# Patient Record
Sex: Female | Born: 1955 | Race: White | Hispanic: No | Smoking: Former smoker
Health system: Southern US, Community
[De-identification: ages and names within clinical notes are randomized; demographics above are authoritative.]

## PROBLEM LIST (undated history)

## (undated) DIAGNOSIS — J40 Bronchitis, not specified as acute or chronic: Secondary | ICD-10-CM

## (undated) DIAGNOSIS — E7800 Pure hypercholesterolemia, unspecified: Secondary | ICD-10-CM

## (undated) DIAGNOSIS — J449 Chronic obstructive pulmonary disease, unspecified: Secondary | ICD-10-CM

## (undated) HISTORY — PX: CERVICAL CONE BIOPSY: SUR198

## (undated) HISTORY — PX: CARPAL TUNNEL RELEASE: SHX101

## (undated) HISTORY — PX: TONSILLECTOMY: SUR1361

## (undated) HISTORY — PX: OTHER SURGICAL HISTORY: SHX169

---

## 2005-07-10 ENCOUNTER — Emergency Department (HOSPITAL_COMMUNITY): Admission: EM | Admit: 2005-07-10 | Discharge: 2005-07-10 | Payer: Self-pay | Admitting: Emergency Medicine

## 2005-11-28 ENCOUNTER — Encounter
Admission: RE | Admit: 2005-11-28 | Discharge: 2006-02-26 | Payer: Self-pay | Admitting: Physical Medicine & Rehabilitation

## 2005-11-28 ENCOUNTER — Ambulatory Visit: Payer: Self-pay | Admitting: Physical Medicine & Rehabilitation

## 2006-01-12 ENCOUNTER — Ambulatory Visit: Payer: Self-pay | Admitting: Physical Medicine & Rehabilitation

## 2006-01-23 ENCOUNTER — Ambulatory Visit: Payer: Self-pay | Admitting: Physical Medicine & Rehabilitation

## 2006-02-20 ENCOUNTER — Encounter
Admission: RE | Admit: 2006-02-20 | Discharge: 2006-05-21 | Payer: Self-pay | Admitting: Physical Medicine & Rehabilitation

## 2006-04-28 ENCOUNTER — Ambulatory Visit: Payer: Self-pay | Admitting: Physical Medicine & Rehabilitation

## 2006-04-28 ENCOUNTER — Encounter
Admission: RE | Admit: 2006-04-28 | Discharge: 2006-07-27 | Payer: Self-pay | Admitting: Physical Medicine & Rehabilitation

## 2006-06-02 ENCOUNTER — Ambulatory Visit: Payer: Self-pay | Admitting: Physical Medicine & Rehabilitation

## 2006-06-14 ENCOUNTER — Ambulatory Visit: Payer: Self-pay | Admitting: Physical Medicine & Rehabilitation

## 2006-06-22 ENCOUNTER — Ambulatory Visit: Payer: Self-pay | Admitting: Physical Medicine & Rehabilitation

## 2006-07-19 ENCOUNTER — Ambulatory Visit: Payer: Self-pay | Admitting: Physical Medicine & Rehabilitation

## 2011-08-10 ENCOUNTER — Other Ambulatory Visit: Payer: Self-pay | Admitting: Family Medicine

## 2011-08-10 ENCOUNTER — Other Ambulatory Visit (HOSPITAL_COMMUNITY)
Admission: RE | Admit: 2011-08-10 | Discharge: 2011-08-10 | Disposition: A | Discharge status: Home / Self Care | Payer: Managed Care, Other (non HMO) | Source: Ambulatory Visit | Attending: Family Medicine | Admitting: Family Medicine

## 2011-08-10 DIAGNOSIS — Z01419 Encounter for gynecological examination (general) (routine) without abnormal findings: Secondary | ICD-10-CM | POA: Insufficient documentation

## 2012-08-10 ENCOUNTER — Other Ambulatory Visit: Payer: Self-pay | Admitting: Family Medicine

## 2012-08-10 DIAGNOSIS — Z1231 Encounter for screening mammogram for malignant neoplasm of breast: Secondary | ICD-10-CM

## 2014-08-18 ENCOUNTER — Encounter (HOSPITAL_COMMUNITY): Payer: Self-pay | Admitting: Emergency Medicine

## 2014-08-18 ENCOUNTER — Emergency Department (HOSPITAL_COMMUNITY): Payer: Managed Care, Other (non HMO)

## 2014-08-18 ENCOUNTER — Inpatient Hospital Stay (HOSPITAL_COMMUNITY)
Admission: EM | Admit: 2014-08-18 | Discharge: 2014-08-21 | DRG: 190 | Disposition: A | Discharge status: Home / Self Care | Payer: Self-pay | Attending: Family Medicine | Admitting: Family Medicine

## 2014-08-18 DIAGNOSIS — E780 Pure hypercholesterolemia: Secondary | ICD-10-CM | POA: Diagnosis present

## 2014-08-18 DIAGNOSIS — J209 Acute bronchitis, unspecified: Secondary | ICD-10-CM | POA: Diagnosis present

## 2014-08-18 DIAGNOSIS — J440 Chronic obstructive pulmonary disease with acute lower respiratory infection: Secondary | ICD-10-CM | POA: Diagnosis present

## 2014-08-18 DIAGNOSIS — Z888 Allergy status to other drugs, medicaments and biological substances status: Secondary | ICD-10-CM

## 2014-08-18 DIAGNOSIS — Z720 Tobacco use: Secondary | ICD-10-CM

## 2014-08-18 DIAGNOSIS — J449 Chronic obstructive pulmonary disease, unspecified: Secondary | ICD-10-CM | POA: Diagnosis present

## 2014-08-18 DIAGNOSIS — F17210 Nicotine dependence, cigarettes, uncomplicated: Secondary | ICD-10-CM | POA: Diagnosis present

## 2014-08-18 DIAGNOSIS — R0789 Other chest pain: Secondary | ICD-10-CM

## 2014-08-18 DIAGNOSIS — J441 Chronic obstructive pulmonary disease with (acute) exacerbation: Principal | ICD-10-CM | POA: Diagnosis present

## 2014-08-18 DIAGNOSIS — J9601 Acute respiratory failure with hypoxia: Secondary | ICD-10-CM | POA: Diagnosis present

## 2014-08-18 HISTORY — DX: Bronchitis, not specified as acute or chronic: J40

## 2014-08-18 HISTORY — DX: Pure hypercholesterolemia, unspecified: E78.00

## 2014-08-18 LAB — BASIC METABOLIC PANEL
Anion gap: 13 (ref 5–15)
BUN: 9 mg/dL (ref 6–23)
CO2: 27 meq/L (ref 19–32)
Calcium: 9.5 mg/dL (ref 8.4–10.5)
Chloride: 102 mEq/L (ref 96–112)
Creatinine, Ser: 0.63 mg/dL (ref 0.50–1.10)
GFR calc Af Amer: >90 mL/min (ref >90)
GFR calc non Af Amer: >90 mL/min (ref >90)
Glucose, Bld: 100 mg/dL — ABNORMAL HIGH (ref 70–99)
POTASSIUM: 4.5 meq/L (ref 3.7–5.3)
Sodium: 142 mEq/L (ref 137–147)

## 2014-08-18 LAB — CBC
HEMATOCRIT: 40.7 % (ref 36.0–46.0)
Hemoglobin: 13.5 g/dL (ref 12.0–15.0)
MCH: 30.7 pg (ref 26.0–34.0)
MCHC: 33.2 g/dL (ref 30.0–36.0)
MCV: 92.5 fL (ref 78.0–100.0)
Platelets: 178 10*3/uL (ref 150–400)
RBC: 4.4 MIL/uL (ref 3.87–5.11)
RDW: 12.6 % (ref 11.5–15.5)
WBC: 6.6 10*3/uL (ref 4.0–10.5)

## 2014-08-18 LAB — TROPONIN I: Troponin I: <0.3 ng/mL (ref <0.30)

## 2014-08-18 LAB — I-STAT TROPONIN, ED: Troponin i, poc: 0 ng/mL (ref 0.00–0.08)

## 2014-08-18 MED ORDER — METHYLPREDNISOLONE SODIUM SUCC 125 MG IJ SOLR
80.0000 mg | Freq: Four times a day (QID) | INTRAMUSCULAR | Status: DC
  Administered 2014-08-18 – 2014-08-21 (×11): 80 mg via INTRAVENOUS
  Filled 2014-08-18 (×14): qty 1.28

## 2014-08-18 MED ORDER — SODIUM CHLORIDE 0.9 % IJ SOLN
3.0000 mL | Freq: Two times a day (BID) | INTRAMUSCULAR | Status: DC
  Administered 2014-08-19 – 2014-08-21 (×5): 3 mL via INTRAVENOUS

## 2014-08-18 MED ORDER — IPRATROPIUM-ALBUTEROL 0.5-2.5 (3) MG/3ML IN SOLN
3.0000 mL | Freq: Once | RESPIRATORY_TRACT | Status: AC
  Administered 2014-08-18: 3 mL via RESPIRATORY_TRACT
  Filled 2014-08-18: qty 3

## 2014-08-18 MED ORDER — LEVOFLOXACIN IN D5W 750 MG/150ML IV SOLN
750.0000 mg | INTRAVENOUS | Status: DC
  Administered 2014-08-18 – 2014-08-21 (×4): 750 mg via INTRAVENOUS
  Filled 2014-08-18 (×4): qty 150

## 2014-08-18 MED ORDER — ONDANSETRON HCL 4 MG PO TABS: 4.0000 mg | ORAL_TABLET | Freq: Four times a day (QID) | ORAL | Status: DC | PRN

## 2014-08-18 MED ORDER — IPRATROPIUM-ALBUTEROL 0.5-2.5 (3) MG/3ML IN SOLN
3.0000 mL | Freq: Four times a day (QID) | RESPIRATORY_TRACT | Status: DC
  Administered 2014-08-18 – 2014-08-21 (×9): 3 mL via RESPIRATORY_TRACT
  Filled 2014-08-18 (×9): qty 3

## 2014-08-18 MED ORDER — ONDANSETRON HCL 4 MG/2ML IJ SOLN: 4.0000 mg | Freq: Four times a day (QID) | INTRAMUSCULAR | Status: DC | PRN

## 2014-08-18 MED ORDER — ACETAMINOPHEN 650 MG RE SUPP: 650.0000 mg | Freq: Four times a day (QID) | RECTAL | Status: DC | PRN

## 2014-08-18 MED ORDER — METHYLPREDNISOLONE SODIUM SUCC 125 MG IJ SOLR
80.0000 mg | Freq: Once | INTRAMUSCULAR | Status: AC
  Administered 2014-08-18: 80 mg via INTRAVENOUS
  Filled 2014-08-18: qty 1.28

## 2014-08-18 MED ORDER — ALBUTEROL SULFATE (2.5 MG/3ML) 0.083% IN NEBU
5.0000 mg | INHALATION_SOLUTION | Freq: Once | RESPIRATORY_TRACT | Status: AC
  Administered 2014-08-18: 5 mg via RESPIRATORY_TRACT
  Filled 2014-08-18: qty 6

## 2014-08-18 MED ORDER — METHYLPREDNISOLONE SODIUM SUCC 125 MG IJ SOLR: 125.0000 mg | Freq: Once | INTRAMUSCULAR | Status: DC

## 2014-08-18 MED ORDER — NICOTINE 21 MG/24HR TD PT24
21.0000 mg | MEDICATED_PATCH | Freq: Every day | TRANSDERMAL | Status: DC
  Administered 2014-08-18 – 2014-08-21 (×4): 21 mg via TRANSDERMAL
  Filled 2014-08-18 (×4): qty 1

## 2014-08-18 MED ORDER — ACETAMINOPHEN 325 MG PO TABS
650.0000 mg | ORAL_TABLET | Freq: Four times a day (QID) | ORAL | Status: DC | PRN
  Administered 2014-08-19 – 2014-08-21 (×2): 650 mg via ORAL
  Filled 2014-08-18 (×2): qty 2

## 2014-08-18 MED ORDER — ENOXAPARIN SODIUM 40 MG/0.4ML ~~LOC~~ SOLN
40.0000 mg | SUBCUTANEOUS | Status: DC
  Administered 2014-08-19 – 2014-08-21 (×3): 40 mg via SUBCUTANEOUS
  Filled 2014-08-18 (×3): qty 0.4

## 2014-08-18 MED ORDER — IPRATROPIUM-ALBUTEROL 0.5-2.5 (3) MG/3ML IN SOLN
3.0000 mL | RESPIRATORY_TRACT | Status: DC | PRN
  Administered 2014-08-20 (×2): 3 mL via RESPIRATORY_TRACT
  Filled 2014-08-18 (×2): qty 3

## 2014-08-18 NOTE — ED Notes (Signed)
Initial neb tx completed Exp wheezing still noted bilaterally O2 sat 94-99% on room air Patient in NAD  Side rails up, call bell in reach

## 2014-08-18 NOTE — ED Notes (Signed)
Exp wheezing still present after 2nd neb tx (Duoneb), see MAR Awaiting CXR Patient in NAD Side rails up, call bell in reach

## 2014-08-18 NOTE — ED Notes (Addendum)
Patient states that she "unable to breathe" Patient states that this has been ongoing since LAST Monday Patient able to speak in full, complete sentences without difficulty--handles secretions Patient has hx of bronchitis and continues to be an everyday smoker O2 sat 96-99% on room air Neb tx given, see MAR

## 2014-08-18 NOTE — Progress Notes (Signed)
UR completed 

## 2014-08-18 NOTE — Progress Notes (Signed)
  CARE MANAGEMENT ED NOTE 08/18/2014  Patient:  Nancy Farmer,Nancy Farmer   Account Number:  [id number]  Date Initiated:  08/18/2014  Documentation initiated by:  Edd ArbourGIBBS,Karia Ehresman  Subjective/Objective Assessment:   58 yr old Nancy Farmer ([place]) who confirmed with CM she has lost her job and insurance recently c/o sob, cough PMH bronchitis     Subjective/Objective Assessment Detail:   agreed to [facility] referral     Action/Plan:   CM spoke with pt to offer [place] uninsured resources see notes below Assisted with instructing pt to call son with phone in her room   Action/Plan Detail:   Anticipated DC Date:  08/21/2014     Status Recommendation to Physician:   Result of Recommendation:    Other ED Services  Consult Working Plan    DC Planning Services  Other  Outpatient Services - Pt will follow up  PCP issues  [facility] / [facility] (established/new)    Choice offered to / List presented to:            Status of service:  Completed, signed off  ED Comments:   ED Comments Detail:  CM spoke with pt who [place]. CM discussed and provided written information for self pay pcps, importance of pcp for f/u care, www.needymeds.org, discounted pharmacies and other [place] resources such as [facility], [facility], affordable care act,  financial assistance, DSS and  health department Reviewed resources for [place] self pay pcps like [facility], family medicine at [address], [facility], general medical clinics, [facility] plus others, medication resources, [facility] out patient pharmacies and housing Pt voiced understanding and appreciation of resources provided  Provided [facility] contact information Referral completed to [facility]

## 2014-08-18 NOTE — ED Notes (Signed)
Patient taken to CXR via stretcher Patient in NAD upon leaving room for testing

## 2014-08-18 NOTE — ED Notes (Signed)
Duoneb tx completed Exp wheezing improved O2 sat on room air 88-89% 2L [place] placed back on patient with O2 sat 91-93% Will make EDP aware

## 2014-08-18 NOTE — ED Notes (Signed)
Pt c/o shob, chest pain when she coughs and back pain since last Monday. while she was at work last week workers were digging the floors and the dust caused he to start having trouble breathing and coughing.  Pt denies asthma but has had PNA and bronchitis in the past.

## 2014-08-18 NOTE — ED Notes (Signed)
Patient back from CXR Exp wheezing still present  Additional Duoneb tx given, see MAR VS updated

## 2014-08-18 NOTE — H&P (Signed)
History and Physical  Nancy Farmer ZOX:[id number] DOB: [birth year] DOA: 08/18/2014   PCP: No PCP Per Patient   Chief Complaint: cough and sob  HPI:  58 year old female without any known chronic medical problems presents with one-week history of shortness of breath and coughing that has worsened over the past 48 hours prior to this admission. The patient denied any fevers, chills but complained of increasing cough with sputum production. No hemoptysis. She has not had any nausea, vomiting, diarrhea. Patient smokes one pack per day x40 years. Patient has had some associated chest discomfort which was worsened somewhat by activity. As a result, the patient in the emergency department for further evaluation. The patient has never been hospitalized for COPD exacerbation. She denies any abdominal pain, dysuria, hematuria, or drugs. She denies any alcohol use. The patient tried to use a leftover inhaler that was provided to her 2 years ago for bronchitis which did not help. In the emergency department, the patient was to just ensure with bronchitic changes. Point-of-care troponin was negative. EKG was negative for any ST-T wave changes. BMP and CBC were unremarkable Assessment/Plan: Acute respiratory failure with hypoxemia -Secondary to COPD exacerbation - Presently not in any respiratory distress -Oxygen saturation 93% on 2 L -Pulmonary hygiene -Plan to wean oxygen if possible fracture saturation greater than 92% COPD exacerbation -Patient states she has had a remote history to "steroids"--when further questioned, patient states that she developed a headache and itching, but does not recall the actual medicine which she took -Plan to give intravenous Solu-Medrol slowly   continue aerosolized albuterol and Atrovent  tobacco abuse  -Tobacco cessation discussed  -Nicoderm patch  Chest pain -ECG without ischemic changes -cycle troponins       Past Medical History  Diagnosis Date  .  Bronchitis   . High cholesterol    History reviewed. No pertinent past surgical history. Social History:  reports that she has been smoking Cigarettes.  She has been smoking about 0.00 packs per day. She does not have any smokeless tobacco history on file. She reports that she does not drink alcohol. Her drug history is not on file. Pt states she has >40 pack yr hx   Family History: reviewed.  No pertinent family history  Allergies  Allergen Reactions  . Other Itching and Rash    steriods      Prior to Admission medications   Medication Sig Start Date End Date Taking? Authorizing Provider  albuterol (PROAIR HFA) 108 (90 BASE) MCG/ACT inhaler Inhale 2 puffs into the lungs every 4 (four) hours as needed for wheezing or shortness of breath.   Yes Historical Provider, MD  guaiFENesin (MUCINEX) 600 MG 12 hr tablet Take 1,200 mg by mouth 2 (two) times daily.   Yes Historical Provider, MD    Review of Systems:  Constitutional:  No weight loss, night sweats, Fevers, chills Head&Eyes: No headache.  No vision loss.  No eye pain or scotoma ENT:  No Difficulty swallowing,Tooth/dental problems,Sore throat,  No ear ache, post nasal drip,  Cardio-vascular:  No chest pain, Orthopnea, PND, swelling in lower extremities,  dizziness, palpitations  GI:  No  abdominal pain, nausea, vomiting, diarrhea, loss of appetite, hematochezia, melena, heartburn, indigestion, Resp:  No coughing up of blood .No wheezing.No chest wall deformity  Skin:  no rash or lesions.  GU:  no dysuria, change in color of urine, no urgency or frequency. No flank pain.  Musculoskeletal:  No joint pain or swelling. No  decreased range of motion. No back pain.  Psych:  No change in mood or affect. Neurologic: No headache, no dysesthesia, no focal weakness, no vision loss. No syncope  Physical Exam: Filed Vitals:   08/18/14 1406 08/18/14 1407 08/18/14 1441 08/18/14 1447  BP: 113/52     Pulse: 90  87 91  Temp:        TempSrc:      Resp:      SpO2: 92% 90% 85% 95%   General:  A&O x 3, NAD, nontoxic, pleasant/cooperative Head/Eye: No conjunctival hemorrhage, no icterus, [place]/AT, No nystagmus ENT:  No icterus,  No thrush, good dentition, no pharyngeal exudate Neck:  No masses, no lymphadenpathy, no bruits CV:  RRR, no rub, no gallop, no S3 Lung:  Bilateral expiratory wheeze. Bibasilar crackles. Abdomen: soft/NT, +BS, nondistended, no peritoneal signs Ext: No cyanosis, No rashes, No petechiae, No lymphangitis, No edema   Labs on Admission:  Basic Metabolic Panel:  Recent Labs Lab 08/18/14 1158  NA 142  K 4.5  CL 102  CO2 27  GLUCOSE 100*  BUN 9  CREATININE 0.63  CALCIUM 9.5   Liver Function Tests: No results found for this basename: AST, ALT, ALKPHOS, BILITOT, PROT, ALBUMIN,  in the last 168 hours No results found for this basename: LIPASE, AMYLASE,  in the last 168 hours No results found for this basename: AMMONIA,  in the last 168 hours CBC:  Recent Labs Lab 08/18/14 1158  WBC 6.6  HGB 13.5  HCT 40.7  MCV 92.5  PLT 178   Cardiac Enzymes: No results found for this basename: CKTOTAL, CKMB, CKMBINDEX, TROPONINI,  in the last 168 hours BNP: No components found with this basename: POCBNP,  CBG: No results found for this basename: GLUCAP,  in the last 168 hours  Radiological Exams on Admission: Dg Chest 2 View (if Patient Has Fever And/or Copd)  08/18/2014   CLINICAL DATA:  Cough and shortness of breath after chemical exposure. Smoker.  EXAM: CHEST  2 VIEW  COMPARISON:  None.  FINDINGS: Normal sized heart. Clear lungs. Mild diffuse peribronchial thickening. Mild thoracic spine degenerative changes and minimal scoliosis.  IMPRESSION: Mild chronic bronchitic changes.  No acute abnormality.   Electronically Signed   By: Gordan PaymentSteve  Reid M.D.   On: 08/18/2014 14:00    EKG: Independently reviewed. Sinus rhythm, no ST-T wave changes    Time spent:60 minutes Code Status:   FULL Family  Communication:   No Family at bedside   Socorro Kanitz, DO  [facility] Pager [phone]  If 7PM-7AM, please contact night-coverage [email or web address] Password [email or web address] 08/18/2014, 3:37 PM

## 2014-08-18 NOTE — ED Provider Notes (Signed)
CSN: [id number]     Arrival date & time 08/18/14  1120 History   First MD Initiated Contact with Patient 08/18/14 1317     Chief Complaint  Patient presents with  . Shortness of Breath     (Consider location/radiation/quality/duration/timing/severity/associated sxs/prior Treatment) HPI Comments: Patient presents to the ER for evaluation of cough and shortness of breath. Patient reports that she has had increased cough over the last 3 days. She has not been running a fever. She has had increased shortness of breath and wheezing, has tried to use her albuterol without improvement. She does not report Lahey diagnosis of asthma or COPD. The inhaler was left over from a previous bronchitis episode.  Patient is a 58 y.o. female presenting with shortness of breath.  Shortness of Breath Associated symptoms: cough     Past Medical History  Diagnosis Date  . Bronchitis   . High cholesterol    History reviewed. No pertinent past surgical history. No family history on file. History  Substance Use Topics  . Smoking status: Current Every Day Smoker    Types: Cigarettes  . Smokeless tobacco: Not on file  . Alcohol Use: No   OB History   Grav Para Term Preterm Abortions TAB SAB Ect Mult Living                 Review of Systems  Respiratory: Positive for cough and shortness of breath.   All other systems reviewed and are negative.     Allergies  Other  Home Medications   Prior to Admission medications   Medication Sig Start Date End Date Taking? Authorizing Provider  albuterol (PROAIR HFA) 108 (90 BASE) MCG/ACT inhaler Inhale 2 puffs into the lungs every 4 (four) hours as needed for wheezing or shortness of breath.   Yes Historical Provider, MD  guaiFENesin (MUCINEX) 600 MG 12 hr tablet Take 1,200 mg by mouth 2 (two) times daily.   Yes Historical Provider, MD   BP 156/71  Pulse 88  Temp(Src) 98.1 F (36.7 C) (Oral)  Resp 20  SpO2 95% Physical Exam  Constitutional: She is  oriented to person, place, and time. She appears well-developed and well-nourished. No distress.  HENT:  Head: Normocephalic and atraumatic.  Right Ear: Hearing normal.  Left Ear: Hearing normal.  Nose: Nose normal.  Mouth/Throat: Oropharynx is clear and moist and mucous membranes are normal.  Eyes: Conjunctivae and EOM are normal. Pupils are equal, round, and reactive to light.  Neck: Normal range of motion. Neck supple.  Cardiovascular: Regular rhythm, S1 normal and S2 normal.  Exam reveals no gallop and no friction rub.   No murmur heard. Pulmonary/Chest: Effort normal. No respiratory distress. She has wheezes. She exhibits no tenderness.  Abdominal: Soft. Normal appearance and bowel sounds are normal. There is no hepatosplenomegaly. There is no tenderness. There is no rebound, no guarding, no tenderness at McBurney's point and negative Murphy's sign. No hernia.  Musculoskeletal: Normal range of motion.  Neurological: She is alert and oriented to person, place, and time. She has normal strength. No cranial nerve deficit or sensory deficit. Coordination normal. GCS eye subscore is 4. GCS verbal subscore is 5. GCS motor subscore is 6.  Skin: Skin is warm, dry and intact. No rash noted. No cyanosis.  Psychiatric: She has a normal mood and affect. Her speech is normal and behavior is normal. Thought content normal.    ED Course  Procedures (including critical care time) Labs Review Labs Reviewed  BASIC  METABOLIC PANEL - Abnormal; Notable for the following:    Glucose, Bld 100 (*)    All other components within normal limits  CBC  I-STAT TROPOININ, ED    Imaging Review No results found.   EKG Interpretation   Date/Time:  Monday August 18 2014 11:40:47 EDT Ventricular Rate:  87 PR Interval:  160 QRS Duration: 83 QT Interval:  360 QTC Calculation: 433 R Axis:   85 Text Interpretation:  Sinus rhythm Normal ECG Confirmed by POLLINA  MD,  CHRISTOPHER ([id number]) on 08/18/2014 1:20:03  PM      MDM   Final diagnoses:  None   bronchitis with acute bronchospasm  Patient presents to the ER for evaluation of cough and shortness of breath. Patient is a long-time smoker, but does not have a current diagnosis of asthma or COPD. She does report a previous history of bronchospasm with bronchitis requiring albuterol. She did try albuterol at home without improvement. The patient had significant bronchospasm here in the ER. She has had serial nebulizer treatments with only minimal improvement. Patient now is experiencing oxygen saturation of 82% on room air after treatments. She was placed back on some of the options will be admitted to the hospital.   Gilda Crease, MD 08/18/14 1450

## 2014-08-18 NOTE — ED Notes (Signed)
Attempted x 2 to start PIV without success--patient pulling away arm and kicking feet during PIV start Will make EDP aware Will ask another ED RN to attempt PIV access

## 2014-08-18 NOTE — ED Notes (Signed)
Per EDP, patient to be admitted

## 2014-08-19 LAB — BASIC METABOLIC PANEL
ANION GAP: 15 (ref 5–15)
BUN: 13 mg/dL (ref 6–23)
CO2: 24 mEq/L (ref 19–32)
Calcium: 9.7 mg/dL (ref 8.4–10.5)
Chloride: 100 mEq/L (ref 96–112)
Creatinine, Ser: 0.6 mg/dL (ref 0.50–1.10)
GFR calc Af Amer: >90 mL/min (ref >90)
Glucose, Bld: 163 mg/dL — ABNORMAL HIGH (ref 70–99)
POTASSIUM: 4.2 meq/L (ref 3.7–5.3)
SODIUM: 139 meq/L (ref 137–147)

## 2014-08-19 LAB — TROPONIN I: Troponin I: <0.3 ng/mL (ref <0.30)

## 2014-08-19 MED ORDER — UNABLE TO FIND: Status: DC

## 2014-08-19 MED ORDER — FAMOTIDINE 20 MG PO TABS
20.0000 mg | ORAL_TABLET | Freq: Two times a day (BID) | ORAL | Status: DC | PRN
  Filled 2014-08-19: qty 1

## 2014-08-19 MED ORDER — ZOLPIDEM TARTRATE 5 MG PO TABS
5.0000 mg | ORAL_TABLET | Freq: Every evening | ORAL | Status: DC | PRN
  Administered 2014-08-19: 5 mg via ORAL
  Filled 2014-08-19: qty 1

## 2014-08-19 NOTE — Progress Notes (Signed)
SATURATION QUALIFICATIONS: (This note is used to comply with regulatory documentation for home oxygen)  Patient Saturations on Room Air at Rest = 96%  Patient Saturations on Room Air while Ambulating = 93%  

## 2014-08-19 NOTE — Progress Notes (Signed)
PROGRESS NOTE  Nancy Farmer AOZ:[id number] DOB: August 30, 1956 DOA: 08/18/2014 PCP: No PCP Per Patient  Assessment/Plan: Acute respiratory failure with hypoxemia  -Secondary to COPD exacerbation  - Presently not in any respiratory distress  -Oxygen saturation 95% on 3 L  -Pulmonary hygiene  -Plan to wean oxygen if possible fracture saturation greater than 92%  -ambulatory pulseox prior to d/c COPD exacerbation  -Patient states she has had a remote history to "steroids"--when further questioned, patient states that she developed a headache and itching, but does not recall the actual medicine which she took  -Plan to give intravenous Solu-Medrol slowly-->pt tolerating well  continue aerosolized albuterol and Atrovent  -continue present dosing of solumedrol--moving better air but still significant wheeze tobacco abuse  -Tobacco cessation discussed  -Nicoderm patch  Chest pain  -ECG without ischemic changes  -cycle troponins--neg   Family Communication:   Pt at beside Disposition Plan:   Home when medically stable   Antibiotics:  Levofloxacin 08/18/14>>>    Procedures/Studies: Dg Chest 2 View (if Patient Has Fever And/or Copd)  08/18/2014   CLINICAL DATA:  Cough and shortness of breath after chemical exposure. Smoker.  EXAM: CHEST  2 VIEW  COMPARISON:  None.  FINDINGS: Normal sized heart. Clear lungs. Mild diffuse peribronchial thickening. Mild thoracic spine degenerative changes and minimal scoliosis.  IMPRESSION: Mild chronic bronchitic changes.  No acute abnormality.   Electronically Signed   By: Gordan Payment M.D.   On: 08/18/2014 14:00         Subjective: Pt breathing better but still has significant DOE.  Patient denies fevers, chills, headache,nausea, vomiting, diarrhea, abdominal pain, dysuria, hematuria   Objective: Filed Vitals:   08/18/14 2113 08/18/14 2130 08/19/14 0500 08/19/14 0846  BP: 143/67  113/64   Pulse: 83  91   Temp: 97.8 F (36.6 C)   97.7 F (36.5 C)   TempSrc: Oral  Oral   Resp: 18  18   Height:      Weight:      SpO2: 95% 95% 96% 96%    Intake/Output Summary (Last 24 hours) at 08/19/14 1416 Last data filed at 08/19/14 0800  Gross per 24 hour  Intake    630 ml  Output      0 ml  Net    630 ml   Weight change:  Exam:   General:  Pt is alert, follows commands appropriately, not in acute distress  HEENT: No icterus, No thrush,  [place]/AT  Cardiovascular: RRR, S1/S2, no rubs, no gallops  Respiratory: bilateral expiratory wheeze  Abdomen: Soft/+BS, non tender, non distended, no guarding  Extremities: No edema, No lymphangitis, No petechiae, No rashes, no synovitis  Data Reviewed: Basic Metabolic Panel:  Recent Labs Lab 08/18/14 1158 08/19/14 0440  NA 142 139  K 4.5 4.2  CL 102 100  CO2 27 24  GLUCOSE 100* 163*  BUN 9 13  CREATININE 0.63 0.60  CALCIUM 9.5 9.7   Liver Function Tests: No results found for this basename: AST, ALT, ALKPHOS, BILITOT, PROT, ALBUMIN,  in the last 168 hours No results found for this basename: LIPASE, AMYLASE,  in the last 168 hours No results found for this basename: AMMONIA,  in the last 168 hours CBC:  Recent Labs Lab 08/18/14 1158  WBC 6.6  HGB 13.5  HCT 40.7  MCV 92.5  PLT 178   Cardiac Enzymes:  Recent Labs Lab 08/18/14 1722 08/18/14 2216 08/19/14 0440  TROPONINI <0.30 <0.30 <0.30   BNP: No components found with this basename: POCBNP,  CBG: No results found for this basename: GLUCAP,  in the last 168 hours  No results found for this or any previous visit (from the past 240 hour(s)).   Scheduled Meds: . enoxaparin (LOVENOX) injection  40 mg Subcutaneous Q24H  . ipratropium-albuterol  3 mL Nebulization Q6H  . levofloxacin (LEVAQUIN) IV  750 mg Intravenous Q24H  . methylPREDNISolone (SOLU-MEDROL) injection  80 mg Intravenous Q6H  . nicotine  21 mg Transdermal Daily  . sodium chloride  3 mL Intravenous Q12H   Continuous Infusions:    Alton Bouknight,  Raeden Schippers, DO  [facility] Pager [phone]  If 7PM-7AM, please contact night-coverage [email or web address] Password [email or web address] 08/19/2014, 2:16 PM   LOS: 1 day

## 2014-08-20 MED ORDER — DM-GUAIFENESIN ER 30-600 MG PO TB12
1.0000 | ORAL_TABLET | Freq: Two times a day (BID) | ORAL | Status: DC
  Administered 2014-08-20 – 2014-08-21 (×3): 1 via ORAL
  Filled 2014-08-20 (×4): qty 1

## 2014-08-20 MED ORDER — GUAIFENESIN-DM 100-10 MG/5ML PO SYRP
5.0000 mL | ORAL_SOLUTION | ORAL | Status: DC | PRN
  Administered 2014-08-20: 5 mL via ORAL
  Filled 2014-08-20: qty 10

## 2014-08-20 NOTE — Progress Notes (Signed)
SATURATION QUALIFICATIONS: (This note is used to comply with regulatory documentation for home oxygen)  Patient Saturations on Room Air at Rest = 91 %  Patient Saturations on Room Air while Ambulating = 87 %  Patient Saturations on 2Liters of oxygen while Ambulating = 95 %  Please briefly explain why patient needs home oxygen: 

## 2014-08-20 NOTE — Progress Notes (Signed)
PROGRESS NOTE  Nancy DeterLaura Farmer ZOX:[id number] DOB: Jul 22, 1956 DOA: 08/18/2014 PCP: No PCP Per Patient  Assessment/Plan:  Acute respiratory failure with hypoxemia  -Secondary to COPD exacerbation  - Presently not in any respiratory distress  -Oxygen saturation 95% on 3 L  -Pulmonary hygiene  -Plan to wean oxygen if possible fracture saturation greater than 92%  -ambulatory pulseox today showed O2 sats 87% on room air  COPD exacerbation  -Patient states she has had a remote history to "steroids"--when further questioned, patient states that she developed a headache and itching, but does not recall the actual medicine which she took  -Plan to give intravenous Solu-Medrol slowly-->pt tolerating well  continue aerosolized albuterol and Atrovent  -continue present dosing of solumedrol--moving better air but still significant wheeze  tobacco abuse  -Tobacco cessation discussed  -Nicoderm patch   Chest pain  -ECG without ischemic changes  -cycle troponins--neg   Family Communication:   Pt at beside Disposition Plan:   Home when medically stable   Antibiotics:  Levofloxacin 08/18/14>>>    Procedures/Studies: Dg Chest 2 View (if Patient Has Fever And/or Copd)  08/18/2014   CLINICAL DATA:  Cough and shortness of breath after chemical exposure. Smoker.  EXAM: CHEST  2 VIEW  COMPARISON:  None.  FINDINGS: Normal sized heart. Clear lungs. Mild diffuse peribronchial thickening. Mild thoracic spine degenerative changes and minimal scoliosis.  IMPRESSION: Mild chronic bronchitic changes.  No acute abnormality.   Electronically Signed   By: Gordan PaymentSteve  Reid M.D.   On: 08/18/2014 14:00         Subjective: Pt breathing better, oxygen saturation dropped to 87% on ambulation. Patient still having rhonchi and coughing up phlegm.   Objective: Filed Vitals:   08/20/14 1326 08/20/14 1343 08/20/14 1344 08/20/14 1359  BP: 102/53     Pulse: 89     Temp: 98.1 F (36.7 C)       TempSrc: Oral     Resp: 18     Height:      Weight:      SpO2: 91% 91% 87% 94%    Intake/Output Summary (Last 24 hours) at 08/20/14 1715 Last data filed at 08/20/14 1300  Gross per 24 hour  Intake    120 ml  Output      0 ml  Net    120 ml   Weight change:  Exam:  Physical Exam: Head: Normocephalic, atraumatic.  Eyes: No signs of jaundice, EOMI Lungs: Normal respiratory effort. Bilateral rhonchi Heart: Regular RR. S1 and S2 normal  Abdomen: BS normoactive. Soft, Nondistended, non-tender.  Extremities: No pretibial edema, no erythema   Data Reviewed: Basic Metabolic Panel:  Recent Labs Lab 08/18/14 1158 08/19/14 0440  NA 142 139  K 4.5 4.2  CL 102 100  CO2 27 24  GLUCOSE 100* 163*  BUN 9 13  CREATININE 0.63 0.60  CALCIUM 9.5 9.7   Liver Function Tests: No results found for this basename: AST, ALT, ALKPHOS, BILITOT, PROT, ALBUMIN,  in the last 168 hours No results found for this basename: LIPASE, AMYLASE,  in the last 168 hours No results found for this basename: AMMONIA,  in the last 168 hours CBC:  Recent Labs Lab 08/18/14 1158  WBC 6.6  HGB 13.5  HCT 40.7  MCV 92.5  PLT 178   Cardiac Enzymes:  Recent Labs Lab 08/18/14 1722 08/18/14 2216 08/19/14 0440  TROPONINI <0.30 <0.30 <0.30   BNP: No components found  with this basename: POCBNP,  CBG: No results found for this basename: GLUCAP,  in the last 168 hours  No results found for this or any previous visit (from the past 240 hour(s)).   Scheduled Meds: . dextromethorphan-guaiFENesin  1 tablet Oral BID  . enoxaparin (LOVENOX) injection  40 mg Subcutaneous Q24H  . ipratropium-albuterol  3 mL Nebulization Q6H  . levofloxacin (LEVAQUIN) IV  750 mg Intravenous Q24H  . methylPREDNISolone (SOLU-MEDROL) injection  80 mg Intravenous Q6H  . nicotine  21 mg Transdermal Daily  . sodium chloride  3 mL Intravenous Q12H   Continuous Infusions:    Meredeth Ide, MD [facility] Pager  [phone]  If 7PM-7AM, please contact night-coverage [email or web address] Password [email or web address] 08/20/2014, 5:15 PM   LOS: 2 days

## 2014-08-21 MED ORDER — PREDNISONE 10 MG PO TABS: ORAL_TABLET | ORAL | Status: DC

## 2014-08-21 MED ORDER — FLUCONAZOLE 150 MG PO TABS
150.0000 mg | ORAL_TABLET | Freq: Once | ORAL | Status: AC
  Administered 2014-08-21: 150 mg via ORAL
  Filled 2014-08-21: qty 1

## 2014-08-21 MED ORDER — IPRATROPIUM-ALBUTEROL 0.5-2.5 (3) MG/3ML IN SOLN
3.0000 mL | Freq: Four times a day (QID) | RESPIRATORY_TRACT | Status: DC
  Administered 2014-08-21 (×2): 3 mL via RESPIRATORY_TRACT
  Filled 2014-08-21 (×3): qty 3

## 2014-08-21 MED ORDER — LEVOFLOXACIN 500 MG PO TABS: 500.0000 mg | ORAL_TABLET | Freq: Every day | ORAL | Status: DC

## 2014-08-21 MED ORDER — ALBUTEROL SULFATE HFA 108 (90 BASE) MCG/ACT IN AERS: 2.0000 | INHALATION_SPRAY | RESPIRATORY_TRACT | Status: DC | PRN

## 2014-08-21 MED ORDER — DM-GUAIFENESIN ER 30-600 MG PO TB12: 1.0000 | ORAL_TABLET | Freq: Two times a day (BID) | ORAL | Status: DC

## 2014-08-21 MED ORDER — BISACODYL 10 MG RE SUPP: 10.0000 mg | Freq: Once | RECTAL | Status: DC

## 2014-08-21 MED ORDER — UNABLE TO FIND: Status: DC

## 2014-08-21 NOTE — Progress Notes (Signed)
SATURATION QUALIFICATIONS: (This note is used to comply with regulatory documentation for home oxygen)  Patient Saturations on Room Air at Rest = 96%  Patient Saturations on Room Air while Ambulating = 98%   Please briefly explain why patient needs home oxygen: 

## 2014-08-21 NOTE — Progress Notes (Signed)
Physician Discharge Summary  Nancy Farmer ZOX:[id number] DOB: 1956-02-22 DOA: 08/18/2014  PCP: No PCP Per Patient  Admit date: 08/18/2014 Discharge date: 08/21/2014  Time spent: *50 minutes  Recommendations for Outpatient Follow-up:  1. *Follow up PCP on 10/15 /15   Discharge Diagnoses:  Active Problems:   COPD exacerbation   Acute respiratory failure with hypoxemia   Tobacco abuse   Atypical chest pain   Discharge Condition: Stable  Diet recommendation: Regular diet  Filed Weights   08/18/14 1649  Weight: 79.379 kg (175 lb)    History of present illness:  58 year old female without any known chronic medical problems presents with one-week history of shortness of breath and coughing that has worsened over the past 48 hours prior to this admission. The patient denied any fevers, chills but complained of increasing cough with sputum production. No hemoptysis. She has not had any nausea, vomiting, diarrhea. Patient smokes one pack per day x40 years. Patient has had some associated chest discomfort which was worsened somewhat by activity. As a result, the patient in the emergency department for further evaluation. The patient has never been hospitalized for COPD exacerbation. She denies any abdominal pain, dysuria, hematuria, or drugs. She denies any alcohol use. The patient tried to use a leftover inhaler that was provided to her 2 years ago for bronchitis which did not help.  In the emergency department, the patient was to just ensure with bronchitic changes. Point-of-care troponin was negative. EKG was negative for any ST-T wave changes. BMP and CBC were unremarkable   Hospital Course:   Acute respiratory failure with hypoxemia  Resolved -Secondary to COPD exacerbation vs Acute bronchitis. Patient does not have spirometry evidence of COPD. She will need spirometry as outpatient. - Presently not in any respiratory distress , she has improved with the current regimen in the  hospital, -Oxygen saturation 97% on RA - Today on ambulation her O2 sats maintained at 98% on RA  Patient adamant on getting oxygen at home. Explained to her that she does not qualify for home O2 based on the numbers as her sats are 98% on RA. Will discharge her home on prednisone taper for five days, levaquin 500 mg po daily x 3 days, albuterol inhaler prn. mucinex dm 1 tab po BID.   tobacco abuse  -Tobacco cessation discussed    Chest pain  Resolved, likely secondary to coughing from acute bronchitis. -ECG without ischemic changes  -cycled troponins--negative  Procedures:  None  Consultations:  None  Discharge Exam: Filed Vitals:   08/21/14 1300  BP: 112/58  Pulse: 78  Temp: 97.8 F (36.6 C)  Resp: 18    General: Appear in no acute distress Cardiovascular: S1s2 RRR Respiratory: Clear bilaterally  Discharge Instructions You were cared for by a hospitalist during your hospital stay. If you have any questions about your discharge medications or the care you received while you were in the hospital after you are discharged, you can call the unit and asked to speak with the hospitalist on call if the hospitalist that took care of you is not available. Once you are discharged, your primary care physician will handle any further medical issues. Please note that NO REFILLS for any discharge medications will be authorized once you are discharged, as it is imperative that you return to your primary care physician (or establish a relationship with a primary care physician if you do not have one) for your aftercare needs so that they can reassess your need for medications and  monitor your lab values.  Discharge Instructions   Diet - low sodium heart healthy    Complete by:  As directed      Increase activity slowly    Complete by:  As directed           Current Discharge Medication List    START taking these medications   Details  dextromethorphan-guaiFENesin (MUCINEX DM)  30-600 MG per 12 hr tablet Take 1 tablet by mouth 2 (two) times daily. Qty: 10 tablet, Refills: 0    levofloxacin (LEVAQUIN) 500 MG tablet Take 1 tablet (500 mg total) by mouth daily. Qty: 3 tablet, Refills: 0    predniSONE (DELTASONE) 10 MG tablet Prednisone 40 mg po daily x 1 day then Prednisone 30 mg po daily x 1 day then Prednisone 20 mg po daily x 1 day then Prednisone 10 mg daily x 1 day then stop... Qty: 10 tablet, Refills: 0    UNABLE TO FIND Nancy Farmer was [facility].  She will be discharged home on 08/21/14. She will need rest for next six days. She can resume her work on 08/27/14 Qty: 1 Act, Refills: 0      CONTINUE these medications which have CHANGED   Details  albuterol (PROAIR HFA) 108 (90 BASE) MCG/ACT inhaler Inhale 2 puffs into the lungs every 4 (four) hours as needed for wheezing or shortness of breath. Qty: 1 Inhaler, Refills: 2      STOP taking these medications     guaiFENesin (MUCINEX) 600 MG 12 hr tablet        Allergies  Allergen Reactions  . Other Itching and Rash    steriods   Follow-up Information   Follow up with [facility]    . (appointment at 1130 AM on Thurs 10/15,  Please keep appoinmtent.)    Contact information:   [address] [place] [address] [phone]       The results of significant diagnostics from this hospitalization (including imaging, microbiology, ancillary and laboratory) are listed below for reference.    Significant Diagnostic Studies: Dg Chest 2 View (if Patient Has Fever And/or Copd)  08/18/2014   CLINICAL DATA:  Cough and shortness of breath after chemical exposure. Smoker.  EXAM: CHEST  2 VIEW  COMPARISON:  None.  FINDINGS: Normal sized heart. Clear lungs. Mild diffuse peribronchial thickening. Mild thoracic spine degenerative changes and minimal scoliosis.  IMPRESSION: Mild chronic bronchitic changes.  No acute abnormality.    Electronically Signed   By: Gordan PaymentSteve  Reid M.D.   On: 08/18/2014 14:00    Microbiology: No results found for this or any previous visit (from the past 240 hour(s)).   Labs: Basic Metabolic Panel:  Recent Labs Lab 08/18/14 1158 08/19/14 0440  NA 142 139  K 4.5 4.2  CL 102 100  CO2 27 24  GLUCOSE 100* 163*  BUN 9 13  CREATININE 0.63 0.60  CALCIUM 9.5 9.7   Liver Function Tests: No results found for this basename: AST, ALT, ALKPHOS, BILITOT, PROT, ALBUMIN,  in the last 168 hours No results found for this basename: LIPASE, AMYLASE,  in the last 168 hours No results found for this basename: AMMONIA,  in the last 168 hours CBC:  Recent Labs Lab 08/18/14 1158  WBC 6.6  HGB 13.5  HCT 40.7  MCV 92.5  PLT 178   Cardiac Enzymes:  Recent Labs Lab 08/18/14 1722 08/18/14 2216 08/19/14 0440  TROPONINI <  0.30 <0.30 <0.30   BNP: BNP (last 3 results) No results found for this basename: PROBNP,  in the last 8760 hours CBG: No results found for this basename: GLUCAP,  in the last 168 hours     Signed:  Katyra Tomassetti S  [facility] 08/21/2014, 4:02 PM

## 2014-08-21 NOTE — Progress Notes (Signed)
Pt not ready to walk again right now. Will check again later.

## 2014-08-21 NOTE — Progress Notes (Signed)
Discharge instructions given to patient. Patient refused to sign after visit summary. Will discharge patient at this time.Setzer, Don BroachAllison Marie

## 2014-08-21 NOTE — Progress Notes (Signed)
Pt states, "[facility] called [facility], ([place]) they don't have MATCH program."  A call to [facility] was made to verified that Match program, which was ok and went through ok. [facility] was called by Assisted Director of 4W to confirm what pt had said. [facility] do take MATCH program.

## 2014-08-21 NOTE — Progress Notes (Signed)
O2 sat on rest=97% on RA. Pt not cooperating to walk without Oxygen, pt stated she can't walk without Oxygen & would not be able to breath. Unable to determine if pt qualify for Oxygen or not.

## 2014-08-21 NOTE — Progress Notes (Signed)
UR COMPLETED  

## 2014-08-21 NOTE — Progress Notes (Signed)
Pt stated she don't have money to get the Rx that MD prescribed & she refused to sign the discharge papers & also refused the IV to be taken out.

## 2014-08-21 NOTE — Progress Notes (Signed)
Pt refused to walk or have O2 Sats , pt stated," I don"t feel like getting up right now."  A call back to encourage pt to walk. Explained she could get [facility] for 6 months[facility], but needed O2 sats, pt hung the telephone up.  Pt did not qualify for home O2, noted at present time in pt's room O2 sats 97% RA. Explained to pt that she did not qualify for home O2. Pt assisted with low cost co-pay medication by [facility], explained to her in details, this is once in one year, with a $3.00 co-pay/prescription.  Pt continued not to want to go home and asked for home O2. Explained to pt again that she did not qualify for home O2 under the guidelines of Medicaid. Encouraged pt if she becomes SOB, to come back to ED.

## 2014-08-21 NOTE — Progress Notes (Signed)
Patient would not leave because she called pharmacy [facility] [address] and they said that her medication sheet given by case management would not be accepted. Theodosia QuayLindsey Currin, AD called and pharmacy said that they would accept the medication sheet. Patient refused to leave until she called pharmacy again to verify this information. Security was called for back up. [facility] verified this information to patient. Patient taken out by wheelchair [place] nurse tech, security, and [place].

## 2014-08-27 NOTE — Discharge Summary (Signed)
Physician Discharge Summary    Nancy Farmer ZOX:[id number] DOB: Dec 09, 1955 DOA: 08/18/2014   PCP: Nancy Farmer   Admit date: 08/18/2014 Discharge date: 08/21/2014   Time spent: *50 minutes   Recommendations for Outpatient Follow-up:   *Follow up PCP on 10/15 /15    Discharge Diagnoses:   Active Problems:   COPD exacerbation   Acute respiratory failure with hypoxemia   Tobacco abuse   Atypical chest pain   Discharge Condition: Stable   Diet recommendation: Regular diet    Filed Weights     08/18/14 1649   Weight:  79.379 kg (175 lb)      History of present illness:   58 year old female without any known chronic medical problems presents with one-week history of shortness of breath and coughing that has worsened over the past 48 hours prior to this admission. The Farmer denied any fevers, chills but complained of increasing cough with sputum production. Nancy hemoptysis. She has not had any nausea, vomiting, diarrhea. Farmer smokes one pack per day x40 years. Farmer has had some associated chest discomfort which was worsened somewhat by activity. As a result, the Farmer in the emergency department for further evaluation. The Farmer has never been hospitalized for COPD exacerbation. She denies any abdominal pain, dysuria, hematuria, or drugs. She denies any alcohol use. The Farmer tried to use a leftover inhaler that was provided to her 2 years ago for bronchitis which did not help.   In the emergency department, the Farmer was to just ensure with bronchitic changes. Point-of-care troponin was negative. EKG was negative for any ST-T wave changes. BMP and CBC were unremarkable     Hospital Course:     Acute respiratory failure with hypoxemia   Resolved -Secondary to COPD exacerbation vs Acute bronchitis. Farmer does not have spirometry evidence of COPD. She will need spirometry as outpatient. - Presently not in any respiratory distress , she has improved with  the current regimen in the hospital, -Oxygen saturation 97% on RA - Today on ambulation her O2 sats maintained at 98% on RA  Farmer adamant on getting oxygen at home. Explained to her that she does not qualify for home O2 based on the numbers as her sats are 98% on RA. Will discharge her home on prednisone taper for five days, levaquin 500 mg po daily x 3 days, albuterol inhaler prn. mucinex dm 1 tab po BID.     tobacco abuse  -Tobacco cessation discussed      Chest pain   Resolved, likely secondary to coughing from acute bronchitis. -ECG without ischemic changes   -cycled troponins--negative   Procedures: None   Consultations: None   Discharge Exam: Filed Vitals:     08/21/14 1300   BP:  112/58   Pulse:  78   Temp:  97.8 F (36.6 C)   Resp:  18      General: Appear in Nancy acute distress Cardiovascular: S1s2 RRR Respiratory: Clear bilaterally   Discharge Instructions You were cared for by a hospitalist during your hospital stay. If you have any questions about your discharge medications or the care you received while you were in the hospital after you are discharged, you can call the unit and asked to speak with the hospitalist on call if the hospitalist that took care of you is not available. Once you are discharged, your primary care physician will handle any further medical issues. Please note that Nancy REFILLS for any discharge medications will be authorized once  you are discharged, as it is imperative that you return to your primary care physician (or establish a relationship with a primary care physician if you do not have one) for your aftercare needs so that they can reassess your need for medications and monitor your lab values.    Discharge Instructions     Diet - low sodium heart healthy     Complete by:  As directed          Increase activity slowly     Complete by:  As directed                 Current Discharge Medication List      START taking  these medications     Details   dextromethorphan-guaiFENesin (MUCINEX DM) 30-600 MG per 12 hr tablet  Take 1 tablet by mouth 2 (two) times daily. Qty: 10 tablet, Refills: 0      levofloxacin (LEVAQUIN) 500 MG tablet  Take 1 tablet (500 mg total) by mouth daily. Qty: 3 tablet, Refills: 0      predniSONE (DELTASONE) 10 MG tablet  Prednisone 40 mg po daily x 1 day then Prednisone 30 mg po daily x 1 day then Prednisone 20 mg po daily x 1 day then Prednisone 10 mg daily x 1 day then stop... Qty: 10 tablet, Refills: 0      UNABLE TO FIND  [name] [facility]. She will be discharged home on 08/21/14. She will need rest for next six days. She can resume her work on 08/27/14 Qty: 1 Act, Refills: 0         CONTINUE these medications which have CHANGED     Details   albuterol (PROAIR HFA) 108 (90 BASE) MCG/ACT inhaler  Inhale 2 puffs into the lungs every 4 (four) hours as needed for wheezing or shortness of breath. Qty: 1 Inhaler, Refills: 2         STOP taking these medications        guaiFENesin (MUCINEX) 600 MG 12 hr tablet            Allergies   Allergen  Reactions   .  Other  Itching and Rash       steriods    Follow-up Information     Follow up with [facility]    . (appointment at 1130 AM on Thurs 10/15,  Please keep appoinmtent.)      Contact information:     [address] [place] [address] [phone]          --------------------------------------------------------------------------------   The results of significant diagnostics from this hospitalization (including imaging, microbiology, ancillary and laboratory) are listed below for reference.       Significant Diagnostic Studies: Dg Chest 2 View (if Farmer Has Fever And/or Copd)   08/18/2014   CLINICAL DATA:  Cough and shortness of breath after chemical exposure. Smoker.  EXAM: CHEST  2 VIEW  COMPARISON:  None.  FINDINGS:  Normal sized heart. Clear lungs. Mild diffuse peribronchial thickening. Mild thoracic spine degenerative changes and minimal scoliosis.  IMPRESSION: Mild chronic bronchitic changes.  Nancy acute abnormality.   Electronically Signed   By: Gordan PaymentSteve  Reid M.D.   On: 08/18/2014 14:00      Microbiology: Nancy results found for this or any previous visit (from the past 240 hour(s)).    Labs: Basic Metabolic Panel: Recent Labs Lab  08/18/14 1158  08/19/14 0440  NA  142  139   K  4.5  4.2   CL  102  100   CO2  27  24   GLUCOSE  100*  163*   BUN  9  13   CREATININE  0.63  0.60   CALCIUM  9.5  9.7    Liver Function Tests: Nancy results found for this basename: AST, ALT, ALKPHOS, BILITOT, PROT, ALBUMIN,  in the last 168 hours Nancy results found for this basename: LIPASE, AMYLASE,  in the last 168 hours Nancy results found for this basename: AMMONIA,  in the last 168 hours CBC: Recent Labs Lab  08/18/14 1158   WBC  6.6   HGB  13.5   HCT  40.7   MCV  92.5   PLT  178    Cardiac Enzymes: Recent Labs Lab  08/18/14 1722  08/18/14 2216  08/19/14 0440   TROPONINI  <0.30  <0.30  <0.30    BNP: BNP (last 3 results) Nancy results found for this basename: PROBNP,  in the last 8760 hours CBG: Nancy results found for this basename: GLUCAP,  in the last 168 hours         Signed:   Mychaela Lennartz S           [facility] 08/21/2014, 4:02 PM

## 2014-08-28 ENCOUNTER — Ambulatory Visit: Payer: Managed Care, Other (non HMO) | Attending: Internal Medicine | Admitting: Internal Medicine

## 2014-08-28 ENCOUNTER — Encounter: Payer: Self-pay | Admitting: Internal Medicine

## 2014-08-28 VITALS — BP 151/83 | HR 77 | Temp 98.4°F | Resp 16 | Ht 66.5 in | Wt 172.0 lb

## 2014-08-28 DIAGNOSIS — J441 Chronic obstructive pulmonary disease with (acute) exacerbation: Secondary | ICD-10-CM

## 2014-08-28 DIAGNOSIS — Z1239 Encounter for other screening for malignant neoplasm of breast: Secondary | ICD-10-CM

## 2014-08-28 DIAGNOSIS — Z1211 Encounter for screening for malignant neoplasm of colon: Secondary | ICD-10-CM

## 2014-08-28 DIAGNOSIS — Z8541 Personal history of malignant neoplasm of cervix uteri: Secondary | ICD-10-CM

## 2014-08-28 DIAGNOSIS — J440 Chronic obstructive pulmonary disease with acute lower respiratory infection: Secondary | ICD-10-CM | POA: Insufficient documentation

## 2014-08-28 LAB — COMPLETE METABOLIC PANEL WITH GFR
ALT: 23 U/L (ref 0–35)
AST: 19 U/L (ref 0–37)
Albumin: 4.3 g/dL (ref 3.5–5.2)
Alkaline Phosphatase: 71 U/L (ref 39–117)
BILIRUBIN TOTAL: 0.6 mg/dL (ref 0.2–1.2)
BUN: 14 mg/dL (ref 6–23)
CO2: 30 mEq/L (ref 19–32)
Calcium: 9.7 mg/dL (ref 8.4–10.5)
Chloride: 101 mEq/L (ref 96–112)
Creat: 0.72 mg/dL (ref 0.50–1.10)
GFR, Est Non African American: >89 mL/min
Glucose, Bld: 102 mg/dL — ABNORMAL HIGH (ref 70–99)
Potassium: 4.3 mEq/L (ref 3.5–5.3)
Sodium: 137 mEq/L (ref 135–145)
Total Protein: 7.1 g/dL (ref 6.0–8.3)

## 2014-08-28 LAB — LIPID PANEL
CHOLESTEROL: 293 mg/dL — AB (ref 0–200)
HDL: 47 mg/dL (ref >39)
LDL CALC: 210 mg/dL — AB (ref 0–99)
Total CHOL/HDL Ratio: 6.2 Ratio
Triglycerides: 181 mg/dL — ABNORMAL HIGH (ref <150)
VLDL: 36 mg/dL (ref 0–40)

## 2014-08-28 LAB — TSH: TSH: 1.51 u[IU]/mL (ref 0.350–4.500)

## 2014-08-28 NOTE — Progress Notes (Deleted)
Patient ID: Nancy Farmer, female   DOB: 1956-02-19, 58 y.o.   MRN: [id number]

## 2014-08-28 NOTE — Progress Notes (Signed)
HFU Pt was in the ED with COPD and acute bronchitis.  Pt states that when she takes a deep breath that she feels pain and pressure in her upper back on the right side.

## 2014-08-28 NOTE — Patient Instructions (Signed)

## 2014-08-28 NOTE — Progress Notes (Signed)
Patient ID: Nancy Farmer, female   DOB: 05/04/1956, 58 y.o.   MRN: [id number]   Nancy Farmer, is a 58 y.o. female  [id number]  [id number]  DOB - 06-17-1956  CC:  Chief Complaint  Patient presents with  . Hospitalization Follow-up  . Establish Care       HPI: Nancy Farmer is a 57 y.o. female here today to establish medical care. PMH significant for cervical cancer and hyperlipidemia.  She is a former 40 pack year smoker.  She was admitted to [facility] from 10/5-10/8 for COPD exacerbation vs acute bronchitis.  She was discharged on Levaquin, which she completed.  Since discharge, she reports using her albuterol inhaler 2-3 times a day because she feels as though she "can't catch my breath."  She has had a productive cough with on episode of greenish brown sputum.  Following coughing episodes she has noticed intermittent right mid-thoracic back pain.  She denies fever, malaise, chills, headache, chest pain, abdominal pain, N/V/D, new weakness tingling or numbness.  Allergies  Allergen Reactions  . Other Itching and Rash    steriods   Past Medical History  Diagnosis Date  . Bronchitis   . High cholesterol    Current Outpatient Prescriptions on File Prior to Visit  Medication Sig Dispense Refill  . albuterol (PROAIR HFA) 108 (90 BASE) MCG/ACT inhaler Inhale 2 puffs into the lungs every 4 (four) hours as needed for wheezing or shortness of breath.  1 Inhaler  2  . dextromethorphan-guaiFENesin (MUCINEX DM) 30-600 MG per 12 hr tablet Take 1 tablet by mouth 2 (two) times daily.  10 tablet  0  . levofloxacin (LEVAQUIN) 500 MG tablet Take 1 tablet (500 mg total) by mouth daily.  3 tablet  0  . predniSONE (DELTASONE) 10 MG tablet Prednisone 40 mg po daily x 1 day then Prednisone 30 mg po daily x 1 day then Prednisone 20 mg po daily x 1 day then Prednisone 10 mg daily x 1 day then stop...  10 tablet  0  . UNABLE TO FIND Nancy Farmer was admitted to [facility] on  08/18/14.  She will be discharged home on 08/21/14. She will need rest for next six days. She can resume her work on 08/27/14  1 Act  0   No current facility-administered medications on file prior to visit.   History reviewed. No pertinent family history. History   Social History  . Marital Status: Divorced    Spouse Name: N/A    Number of Children: N/A  . Years of Education: N/A   Occupational History  . Not on file.   Social History Main Topics  . Smoking status: Current Every Day Smoker    Types: Cigarettes  . Smokeless tobacco: Never Used  . Alcohol Use: No  . Drug Use: No  . Sexual Activity: No   Other Topics Concern  . Not on file   Social History Narrative  . No narrative on file    Review of Systems: Constitutional: Negative for fever, chills, diaphoresis, activity change, appetite change and fatigue. HENT: Negative for ear pain, nosebleeds, congestion, facial swelling, rhinorrhea, neck pain, neck stiffness and ear discharge.  Eyes: Negative for pain, discharge, redness, itching and visual disturbance. Respiratory: Negative for choking,shortness of breath, wheezing and stridor. POSITIVE for cough. Cardiovascular: Negative for chest pain, palpitations and leg swelling. Gastrointestinal: Negative for abdominal distention. Genitourinary: Negative for dysuria, urgency, frequency, hematuria, flank pain, decreased urine volume, difficulty urinating and dyspareunia.  Musculoskeletal:  Negative for back pain, joint swelling, arthralgia and gait problem. Neurological: Negative for dizziness, tremors, seizures, syncope, facial asymmetry, speech difficulty, weakness, light-headedness, numbness and headaches.  Hematological: Negative for adenopathy. Does not bruise/bleed easily. Psychiatric/Behavioral: Negative for hallucinations, behavioral problems, confusion, dysphoric mood, decreased concentration and agitation.    Objective:   Filed Vitals:   08/28/14 1149  BP: 151/83    Pulse: 77  Temp: 98.4 F (36.9 C)  Resp: 16    Physical Exam: Constitutional: Patient appears well-developed and well-nourished. No distress. HENT: Normocephalic, atraumatic, External right and left ear normal. Oropharynx is clear and moist.  Eyes: Conjunctivae and EOM are normal. PERRLA, no scleral icterus. Neck: Normal ROM. Neck supple. No JVD. No tracheal deviation. No thyromegaly. CVS: RRR, S1/S2 +, no murmurs, no gallops, no carotid bruit.  Pulmonary: Effort and breath sounds normal, no stridor, rhonchi, wheezes, rales.  Abdominal: Soft. BS +, no distension, tenderness, rebound or guarding.  Musculoskeletal: Normal range of motion. No edema and no tenderness.  Lymphadenopathy: No lymphadenopathy noted, cervical, inguinal or axillary Neuro: Alert. Normal reflexes, muscle tone coordination. No cranial nerve deficit. Skin: Skin is warm and dry. No rash noted. Not diaphoretic. No erythema. No pallor. Psychiatric: Normal mood and affect. Behavior, judgment, thought content normal.  Lab Results  Component Value Date   WBC 6.6 08/18/2014   HGB 13.5 08/18/2014   HCT 40.7 08/18/2014   MCV 92.5 08/18/2014   PLT 178 08/18/2014   Lab Results  Component Value Date   CREATININE 0.60 08/19/2014   BUN 13 08/19/2014   NA 139 08/19/2014   K 4.2 08/19/2014   CL 100 08/19/2014   CO2 24 08/19/2014    No results found for this basename: HGBA1C   Lipid Panel  No results found for this basename: chol, trig, hdl, cholhdl, vldl, ldlcalc       Assessment and plan:   1. COPD exacerbation  Referral for spirometry Cont albuterol  2. Preventative care  - COMPLETE METABOLIC PANEL WITH GFR - POCT glycosylated hemoglobin (Hb A1C) - Lipid panel - TSH - Urinalysis, Complete -mammogram, colonoscopy Referral to GYN for pap    Nancy Ambriz, FNP-student  Evaluation and management procedures were performed by the Advanced Practitioner under my supervision and collaboration. I have reviewed  the Advanced Practitioner's note and chart, and I agree with the management and plan.   Assessment & Plan   1. COPD exacerbation  - COMPLETE METABOLIC PANEL WITH GFR - POCT glycosylated hemoglobin (Hb A1C) - Lipid panel - TSH - Urinalysis, Complete - Pulmonary function test; Future  2. Encounter for breast cancer screening other than mammogram  - [facility] DIGITAL SCREENING BILATERAL; Future  3. Colon cancer screening  - [facility] COLONOSCOPY - Ambulatory referral to Gastroenterology  4. Hx of cervical cancer  - Ambulatory referral to Gynecology    Patient have been counseled extensively about nutrition and exercise  Return in about 6 months (around 02/27/2015), or if symptoms worsen or fail to improve, for COPD.  The patient was given clear instructions to go to ER or return to medical center if symptoms don't improve, worsen or new problems develop. The patient verbalized understanding. The patient was told to call to get lab results if they haven't heard anything in the next week.   This note has been created with Education officer, environmentalDragon speech recognition software and smart phrase technology. Any transcriptional errors are unintentional.    JEGEDE, OLUGBEMIGA, MD, MHA, FACP, FAAP [place] [facility]  [place], [place] [phone]   08/28/2014 12:54 PM

## 2014-08-29 LAB — URINALYSIS, COMPLETE
BACTERIA UA: NONE SEEN
Bilirubin Urine: NEGATIVE
CRYSTALS: NONE SEEN
Casts: NONE SEEN
Glucose, UA: NEGATIVE mg/dL
Hgb urine dipstick: NEGATIVE
KETONES UR: NEGATIVE mg/dL
Leukocytes, UA: NEGATIVE
Nitrite: NEGATIVE
PH: 5 (ref 5.0–8.0)
Protein, ur: NEGATIVE mg/dL
Specific Gravity, Urine: 1.007 (ref 1.005–1.030)
Squamous Epithelial / LPF: NONE SEEN
UROBILINOGEN UA: 0.2 mg/dL (ref 0.0–1.0)

## 2014-09-01 ENCOUNTER — Telehealth: Payer: Self-pay | Admitting: Internal Medicine

## 2014-09-01 ENCOUNTER — Telehealth: Payer: Self-pay | Admitting: Emergency Medicine

## 2014-09-01 NOTE — Telephone Encounter (Signed)
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 Completed all medication prescribed

## 2014-09-01 NOTE — Telephone Encounter (Signed)
Left lab results on VM with instructions to follow low carbohydrate diet/exercise 3 times/week

## 2014-09-01 NOTE — Telephone Encounter (Signed)
Pt. Called back in regards to results, pt states that she would like to get called on her cell phone and could not receive the VM left at her home phone number, please f/u at [phone].

## 2014-09-01 NOTE — Telephone Encounter (Signed)
Patient calling to speak to a nurse, states that she has a few questions in regards to her past office visit (08/28/14). Patient did not provide any details. Please followup

## 2014-09-01 NOTE — Telephone Encounter (Signed)
Patient has called to receive some medical advice; please f/u with patient

## 2014-09-17 ENCOUNTER — Telehealth: Payer: Self-pay

## 2014-09-17 NOTE — Telephone Encounter (Signed)
Patient called stating she received a call saying she had an appointment and she does not know the reason why. Informed patient her appointment was scheduled because [facility]. Asked patient if she has been having any GYN issues. Patient states she has not had a pap smear in over two years and did have an abnormal one years back. Patient states she lost her insurance in July. Referred patient to free pap clinic [phone]. Informed her if the pap is abnormal [facility] and then to us. Patient verbalized understanding and gratitude. Informed patient we would cancel her appointment tomorrow. Patient verbalized understanding.

## 2014-09-18 ENCOUNTER — Encounter: Payer: Managed Care, Other (non HMO) | Admitting: Obstetrics & Gynecology

## 2014-09-18 ENCOUNTER — Ambulatory Visit: Payer: Managed Care, Other (non HMO)

## 2014-10-16 ENCOUNTER — Ambulatory Visit: Payer: Managed Care, Other (non HMO)

## 2014-10-30 ENCOUNTER — Ambulatory Visit (HOSPITAL_COMMUNITY)
Admission: RE | Admit: 2014-10-30 | Discharge: 2014-10-30 | Disposition: A | Discharge status: Home / Self Care | Payer: Managed Care, Other (non HMO) | Source: Ambulatory Visit | Attending: Internal Medicine | Admitting: Internal Medicine

## 2014-10-30 DIAGNOSIS — Z1239 Encounter for other screening for malignant neoplasm of breast: Secondary | ICD-10-CM

## 2015-10-05 ENCOUNTER — Other Ambulatory Visit: Payer: Self-pay

## 2015-10-05 DIAGNOSIS — Z1231 Encounter for screening mammogram for malignant neoplasm of breast: Secondary | ICD-10-CM

## 2015-11-05 ENCOUNTER — Ambulatory Visit: Payer: Self-pay

## 2015-11-11 ENCOUNTER — Encounter: Payer: Self-pay | Admitting: Physician Assistant

## 2015-11-11 ENCOUNTER — Ambulatory Visit (INDEPENDENT_AMBULATORY_CARE_PROVIDER_SITE_OTHER): Payer: Managed Care, Other (non HMO) | Admitting: Physician Assistant

## 2015-11-11 ENCOUNTER — Ambulatory Visit (INDEPENDENT_AMBULATORY_CARE_PROVIDER_SITE_OTHER): Payer: Managed Care, Other (non HMO)

## 2015-11-11 VITALS — BP 117/67 | HR 74 | Temp 97.6°F | Ht 66.0 in | Wt 196.0 lb

## 2015-11-11 DIAGNOSIS — J209 Acute bronchitis, unspecified: Secondary | ICD-10-CM | POA: Diagnosis not present

## 2015-11-11 MED ORDER — LEVOFLOXACIN 500 MG PO TABS: 500.0000 mg | ORAL_TABLET | Freq: Every day | ORAL | Status: DC

## 2015-11-11 NOTE — Patient Instructions (Signed)

## 2015-11-11 NOTE — Progress Notes (Signed)
Subjective:     Patient ID: Nancy Farmer, female   DOB: 07-24-1956, 59 y.o.   MRN: [id number]  HPI Pt with 4-5 day hx of chest congestion with prod cough of brownish sputum Last yr with 3 day hosp due to bronchitis Currently using Mucinex and Alb Inhaler  Review of Systems  Constitutional: Positive for fever, activity change, appetite change and fatigue.  HENT: Positive for congestion and postnasal drip. Negative for ear pain, sinus pressure, sneezing and sore throat.   Respiratory: Positive for cough and wheezing.   Cardiovascular: Negative.        Objective:   Physical Exam  Constitutional: She appears well-developed and well-nourished.  HENT:  Right Ear: External ear normal.  Left Ear: External ear normal.  Mouth/Throat: Oropharynx is clear and moist. No oropharyngeal exudate.  Neck: Neck supple.  Cardiovascular: Normal rate, regular rhythm and normal heart sounds.   Pulmonary/Chest: Effort normal. No respiratory distress. She has wheezes. She has no rales. She exhibits no tenderness.  Lymphadenopathy:    She has no cervical adenopathy.  Nursing note and vitals reviewed. CXR- no def infil seen     Assessment:     Bronchitis    Plan:     Fluids  Rest Continue with Alb prn Continue with Mucinex Levaquin  500mg  q d x 1 week F/U prn

## 2015-12-03 ENCOUNTER — Ambulatory Visit
Admission: RE | Admit: 2015-12-03 | Discharge: 2015-12-03 | Disposition: A | Discharge status: Home / Self Care | Payer: Managed Care, Other (non HMO) | Source: Ambulatory Visit

## 2015-12-03 DIAGNOSIS — Z1231 Encounter for screening mammogram for malignant neoplasm of breast: Secondary | ICD-10-CM

## 2016-04-14 ENCOUNTER — Other Ambulatory Visit: Payer: Self-pay | Admitting: Family Medicine

## 2016-04-14 ENCOUNTER — Other Ambulatory Visit (HOSPITAL_COMMUNITY)
Admission: RE | Admit: 2016-04-14 | Discharge: 2016-04-14 | Disposition: A | Discharge status: Home / Self Care | Payer: Managed Care, Other (non HMO) | Source: Ambulatory Visit | Attending: Family Medicine | Admitting: Family Medicine

## 2016-04-14 DIAGNOSIS — Z124 Encounter for screening for malignant neoplasm of cervix: Secondary | ICD-10-CM | POA: Insufficient documentation

## 2016-04-18 LAB — CYTOLOGY - PAP

## 2016-08-24 ENCOUNTER — Other Ambulatory Visit: Payer: Self-pay | Admitting: Obstetrics and Gynecology

## 2016-08-24 ENCOUNTER — Other Ambulatory Visit (HOSPITAL_COMMUNITY)
Admission: RE | Admit: 2016-08-24 | Discharge: 2016-08-24 | Disposition: A | Discharge status: Home / Self Care | Payer: Managed Care, Other (non HMO) | Source: Ambulatory Visit | Attending: Obstetrics and Gynecology | Admitting: Obstetrics and Gynecology

## 2016-08-24 DIAGNOSIS — Z1151 Encounter for screening for human papillomavirus (HPV): Secondary | ICD-10-CM | POA: Insufficient documentation

## 2016-08-25 LAB — CERVICOVAGINAL ANCILLARY ONLY: HPV (WINDOPATH): NOT DETECTED

## 2016-10-10 ENCOUNTER — Encounter: Payer: Self-pay | Admitting: Family

## 2016-10-10 ENCOUNTER — Encounter (INDEPENDENT_AMBULATORY_CARE_PROVIDER_SITE_OTHER): Payer: Self-pay

## 2016-10-10 ENCOUNTER — Ambulatory Visit (INDEPENDENT_AMBULATORY_CARE_PROVIDER_SITE_OTHER): Payer: Managed Care, Other (non HMO) | Admitting: Family

## 2016-10-10 VITALS — BP 108/68 | HR 77 | Temp 98.0°F | Ht 66.0 in | Wt 198.4 lb

## 2016-10-10 DIAGNOSIS — J209 Acute bronchitis, unspecified: Secondary | ICD-10-CM | POA: Diagnosis not present

## 2016-10-10 MED ORDER — ALBUTEROL SULFATE HFA 108 (90 BASE) MCG/ACT IN AERS: 2.0000 | INHALATION_SPRAY | RESPIRATORY_TRACT | 2 refills | Status: DC | PRN

## 2016-10-10 MED ORDER — LEVOFLOXACIN 500 MG PO TABS: 500.0000 mg | ORAL_TABLET | Freq: Every day | ORAL | 0 refills | Status: DC

## 2016-10-10 NOTE — Progress Notes (Signed)
   Subjective:    Patient ID: Nancy DeterLaura Penn-Eckman, female    DOB: Nov 29, 1955, 60 y.o.   MRN: [id number]  Cough  This is a new problem. The current episode started in the past 7 days. The problem has been waxing and waning. The cough is productive of brown sputum and productive of purulent sputum. Associated symptoms include myalgias, nasal congestion, shortness of breath and wheezing. Pertinent negatives include no chills, ear congestion, ear pain, fever, headaches, postnasal drip, rhinorrhea or sore throat. The symptoms are aggravated by lying down. She has tried rest and OTC cough suppressant for the symptoms. The treatment provided mild relief. There is no history of asthma or COPD.      Review of Systems  Constitutional: Negative for chills and fever.  HENT: Negative for ear pain, postnasal drip, rhinorrhea and sore throat.   Respiratory: Positive for cough, shortness of breath and wheezing.   Musculoskeletal: Positive for myalgias.  Neurological: Negative for headaches.  All other systems reviewed and are negative.      Objective:   Physical Exam  Constitutional: She is oriented to person, place, and time. She appears well-developed and well-nourished. No distress.  HENT:  Head: Normocephalic and atraumatic.  Right Ear: External ear normal.  Left Ear: External ear normal.  Mouth/Throat: Posterior oropharyngeal erythema present.  Eyes: Pupils are equal, round, and reactive to light.  Neck: Normal range of motion. Neck supple. No thyromegaly present.  Cardiovascular: Normal rate, regular rhythm, normal heart sounds and intact distal pulses.   No murmur heard. Pulmonary/Chest: Effort normal and breath sounds normal. No respiratory distress. She has no wheezes.  Intermittent nonproductive cough  Abdominal: Soft. Bowel sounds are normal. She exhibits no distension. There is no tenderness.  Musculoskeletal: Normal range of motion. She exhibits no edema or tenderness.  Neurological:  She is alert and oriented to person, place, and time. She has normal reflexes. No cranial nerve deficit.  Skin: Skin is warm and dry.  Psychiatric: She has a normal mood and affect. Her behavior is normal. Judgment and thought content normal.  Vitals reviewed.     BP 108/68   Pulse 77   Temp 98 F (36.7 C) (Oral)   Ht 5\' 6"  (1.676 m)   Wt 198 lb 6.4 oz (90 kg)   BMI 32.02 kg/m      Assessment & Plan:  1. Acute bronchitis, unspecified organism -- Take meds as prescribed - Use a cool mist humidifier  -Use saline nose sprays frequently -Saline irrigations of the nose can be very helpful if done frequently.  * 4X daily for 1 week*  * Use of a nettie pot can be helpful with this. Follow directions with this* -Force fluids -For any cough or congestion  Use plain Mucinex- regular strength or max strength is fine   * Children- consult with Pharmacist for dosing -For fever or aces or pains- take tylenol or ibuprofen appropriate for age and weight.  * for fevers greater than 101 orally you may alternate ibuprofen and tylenol every  3 hours. -Throat lozenges if help - albuterol (PROAIR HFA) 108 (90 Base) MCG/ACT inhaler; Inhale 2 puffs into the lungs every 4 (four) hours as needed for wheezing or shortness of breath.  Dispense: 1 Inhaler; Refill: 2 - levofloxacin (LEVAQUIN) 500 MG tablet; Take 1 tablet (500 mg total) by mouth daily.  Dispense: 7 tablet; Refill: 0  Jannifer Rodneyhristy Soffia Doshier, FNP

## 2016-10-10 NOTE — Patient Instructions (Signed)

## 2016-10-14 ENCOUNTER — Telehealth: Payer: Self-pay | Admitting: Pediatrics

## 2016-10-14 NOTE — Telephone Encounter (Signed)
Antibiotics stay in system long after last dose.  Try mucinex, allergy medications, nasal sprays to aid with symptoms.

## 2017-01-24 ENCOUNTER — Other Ambulatory Visit: Payer: Self-pay | Admitting: Family Medicine

## 2017-01-24 DIAGNOSIS — Z1231 Encounter for screening mammogram for malignant neoplasm of breast: Secondary | ICD-10-CM

## 2017-02-16 ENCOUNTER — Ambulatory Visit: Payer: Managed Care, Other (non HMO)

## 2017-11-30 DIAGNOSIS — R7303 Prediabetes: Secondary | ICD-10-CM | POA: Diagnosis not present

## 2017-11-30 DIAGNOSIS — Z0001 Encounter for general adult medical examination with abnormal findings: Secondary | ICD-10-CM | POA: Diagnosis not present

## 2017-11-30 DIAGNOSIS — E782 Mixed hyperlipidemia: Secondary | ICD-10-CM | POA: Diagnosis not present

## 2017-11-30 DIAGNOSIS — E559 Vitamin D deficiency, unspecified: Secondary | ICD-10-CM | POA: Diagnosis not present

## 2018-07-11 DIAGNOSIS — M8580 Other specified disorders of bone density and structure, unspecified site: Secondary | ICD-10-CM | POA: Diagnosis not present

## 2018-07-11 DIAGNOSIS — M7751 Other enthesopathy of right foot: Secondary | ICD-10-CM | POA: Diagnosis not present

## 2018-07-11 DIAGNOSIS — Z0189 Encounter for other specified special examinations: Secondary | ICD-10-CM | POA: Diagnosis not present

## 2018-07-11 DIAGNOSIS — M19071 Primary osteoarthritis, right ankle and foot: Secondary | ICD-10-CM | POA: Diagnosis not present

## 2018-07-11 DIAGNOSIS — M7661 Achilles tendinitis, right leg: Secondary | ICD-10-CM | POA: Diagnosis not present

## 2018-11-03 DIAGNOSIS — Z6834 Body mass index (BMI) 34.0-34.9, adult: Secondary | ICD-10-CM | POA: Diagnosis not present

## 2018-11-03 DIAGNOSIS — J4541 Moderate persistent asthma with (acute) exacerbation: Secondary | ICD-10-CM | POA: Diagnosis not present

## 2019-08-13 ENCOUNTER — Telehealth: Payer: Self-pay | Admitting: Family

## 2019-08-13 NOTE — Telephone Encounter (Signed)
Patient will need to be seen.  Patient aware and appt made.

## 2019-08-14 DIAGNOSIS — E559 Vitamin D deficiency, unspecified: Secondary | ICD-10-CM | POA: Diagnosis not present

## 2019-08-14 DIAGNOSIS — E669 Obesity, unspecified: Secondary | ICD-10-CM | POA: Diagnosis not present

## 2019-08-14 DIAGNOSIS — E782 Mixed hyperlipidemia: Secondary | ICD-10-CM | POA: Diagnosis not present

## 2019-08-14 DIAGNOSIS — R7303 Prediabetes: Secondary | ICD-10-CM | POA: Diagnosis not present

## 2019-08-16 ENCOUNTER — Encounter: Payer: Managed Care, Other (non HMO) | Admitting: Family

## 2019-09-25 ENCOUNTER — Encounter: Payer: Self-pay | Admitting: Cardiology

## 2020-01-16 DIAGNOSIS — E782 Mixed hyperlipidemia: Secondary | ICD-10-CM | POA: Diagnosis not present

## 2020-01-16 DIAGNOSIS — R7309 Other abnormal glucose: Secondary | ICD-10-CM | POA: Diagnosis not present

## 2020-01-21 DIAGNOSIS — J449 Chronic obstructive pulmonary disease, unspecified: Secondary | ICD-10-CM | POA: Diagnosis not present

## 2020-01-21 DIAGNOSIS — R7309 Other abnormal glucose: Secondary | ICD-10-CM | POA: Diagnosis not present

## 2020-01-21 DIAGNOSIS — E782 Mixed hyperlipidemia: Secondary | ICD-10-CM | POA: Diagnosis not present

## 2020-01-21 DIAGNOSIS — F439 Reaction to severe stress, unspecified: Secondary | ICD-10-CM | POA: Diagnosis not present

## 2021-03-11 ENCOUNTER — Emergency Department (HOSPITAL_COMMUNITY): Payer: Commercial Managed Care - PPO

## 2021-03-11 ENCOUNTER — Other Ambulatory Visit: Payer: Self-pay

## 2021-03-11 ENCOUNTER — Encounter (HOSPITAL_COMMUNITY): Payer: Self-pay

## 2021-03-11 ENCOUNTER — Inpatient Hospital Stay (HOSPITAL_COMMUNITY): Payer: Commercial Managed Care - PPO

## 2021-03-11 ENCOUNTER — Inpatient Hospital Stay (HOSPITAL_COMMUNITY)
Admission: EM | Admit: 2021-03-11 | Discharge: 2021-03-15 | DRG: 177 | Disposition: A | Discharge status: Home / Self Care | Payer: Commercial Managed Care - PPO | Attending: Family Medicine | Admitting: Family Medicine

## 2021-03-11 DIAGNOSIS — J440 Chronic obstructive pulmonary disease with acute lower respiratory infection: Secondary | ICD-10-CM | POA: Diagnosis present

## 2021-03-11 DIAGNOSIS — J449 Chronic obstructive pulmonary disease, unspecified: Secondary | ICD-10-CM | POA: Diagnosis not present

## 2021-03-11 DIAGNOSIS — U071 COVID-19: Principal | ICD-10-CM | POA: Diagnosis present

## 2021-03-11 DIAGNOSIS — J441 Chronic obstructive pulmonary disease with (acute) exacerbation: Secondary | ICD-10-CM | POA: Diagnosis not present

## 2021-03-11 DIAGNOSIS — J1282 Pneumonia due to coronavirus disease 2019: Secondary | ICD-10-CM | POA: Diagnosis present

## 2021-03-11 DIAGNOSIS — E7800 Pure hypercholesterolemia, unspecified: Secondary | ICD-10-CM | POA: Diagnosis present

## 2021-03-11 DIAGNOSIS — R0602 Shortness of breath: Secondary | ICD-10-CM | POA: Diagnosis not present

## 2021-03-11 DIAGNOSIS — Z87891 Personal history of nicotine dependence: Secondary | ICD-10-CM

## 2021-03-11 DIAGNOSIS — J9601 Acute respiratory failure with hypoxia: Secondary | ICD-10-CM | POA: Diagnosis present

## 2021-03-11 HISTORY — DX: Chronic obstructive pulmonary disease, unspecified: J44.9

## 2021-03-11 LAB — CREATININE, SERUM
Creatinine, Ser: 0.56 mg/dL (ref 0.44–1.00)
GFR, Estimated: >60 mL/min (ref >60)

## 2021-03-11 LAB — CBC WITH DIFFERENTIAL/PLATELET
Abs Immature Granulocytes: 0.02 10*3/uL (ref 0.00–0.07)
Basophils Absolute: 0 10*3/uL (ref 0.0–0.1)
Basophils Relative: 0 %
Eosinophils Absolute: 0 10*3/uL (ref 0.0–0.5)
Eosinophils Relative: 1 %
HCT: 43.6 % (ref 36.0–46.0)
Hemoglobin: 13.8 g/dL (ref 12.0–15.0)
Immature Granulocytes: 0 %
Lymphocytes Relative: 13 %
Lymphs Abs: 0.8 10*3/uL (ref 0.7–4.0)
MCH: 30.1 pg (ref 26.0–34.0)
MCHC: 31.7 g/dL (ref 30.0–36.0)
MCV: 95.2 fL (ref 80.0–100.0)
Monocytes Absolute: 0.6 10*3/uL (ref 0.1–1.0)
Monocytes Relative: 10 %
Neutro Abs: 4.8 10*3/uL (ref 1.7–7.7)
Neutrophils Relative %: 76 %
Platelets: 139 10*3/uL — ABNORMAL LOW (ref 150–400)
RBC: 4.58 MIL/uL (ref 3.87–5.11)
RDW: 13.3 % (ref 11.5–15.5)
WBC: 6.3 10*3/uL (ref 4.0–10.5)
nRBC: 0 % (ref 0.0–0.2)

## 2021-03-11 LAB — RESP PANEL BY RT-PCR (FLU A&B, COVID) ARPGX2
Influenza A by PCR: NEGATIVE
Influenza B by PCR: NEGATIVE
SARS Coronavirus 2 by RT PCR: POSITIVE — AB

## 2021-03-11 LAB — CBC
HCT: 39.2 % (ref 36.0–46.0)
Hemoglobin: 12.7 g/dL (ref 12.0–15.0)
MCH: 30.7 pg (ref 26.0–34.0)
MCHC: 32.4 g/dL (ref 30.0–36.0)
MCV: 94.7 fL (ref 80.0–100.0)
Platelets: 145 10*3/uL — ABNORMAL LOW (ref 150–400)
RBC: 4.14 MIL/uL (ref 3.87–5.11)
RDW: 13.3 % (ref 11.5–15.5)
WBC: 6.7 10*3/uL (ref 4.0–10.5)
nRBC: 0 % (ref 0.0–0.2)

## 2021-03-11 LAB — COMPREHENSIVE METABOLIC PANEL
ALT: 58 U/L — ABNORMAL HIGH (ref 0–44)
AST: 52 U/L — ABNORMAL HIGH (ref 15–41)
Albumin: 4.3 g/dL (ref 3.5–5.0)
Alkaline Phosphatase: 102 U/L (ref 38–126)
Anion gap: 10 (ref 5–15)
BUN: 12 mg/dL (ref 8–23)
CO2: 25 mmol/L (ref 22–32)
Calcium: 9.1 mg/dL (ref 8.9–10.3)
Chloride: 103 mmol/L (ref 98–111)
Creatinine, Ser: 0.66 mg/dL (ref 0.44–1.00)
GFR, Estimated: >60 mL/min (ref >60)
Glucose, Bld: 110 mg/dL — ABNORMAL HIGH (ref 70–99)
Potassium: 4.1 mmol/L (ref 3.5–5.1)
Sodium: 138 mmol/L (ref 135–145)
Total Bilirubin: 0.8 mg/dL (ref 0.3–1.2)
Total Protein: 8.1 g/dL (ref 6.5–8.1)

## 2021-03-11 LAB — FIBRINOGEN: Fibrinogen: 406 mg/dL (ref 210–475)

## 2021-03-11 LAB — LACTIC ACID, PLASMA: Lactic Acid, Venous: 1.6 mmol/L (ref 0.5–1.9)

## 2021-03-11 LAB — PROCALCITONIN: Procalcitonin: <0.1 ng/mL

## 2021-03-11 LAB — C-REACTIVE PROTEIN: CRP: 5 mg/dL — ABNORMAL HIGH (ref <1.0)

## 2021-03-11 LAB — TRIGLYCERIDES: Triglycerides: 81 mg/dL (ref <150)

## 2021-03-11 LAB — FERRITIN: Ferritin: 97 ng/mL (ref 11–307)

## 2021-03-11 LAB — LACTATE DEHYDROGENASE: LDH: 260 U/L — ABNORMAL HIGH (ref 98–192)

## 2021-03-11 LAB — GROUP A STREP BY PCR: Group A Strep by PCR: NOT DETECTED

## 2021-03-11 LAB — HIV ANTIBODY (ROUTINE TESTING W REFLEX): HIV Screen 4th Generation wRfx: NONREACTIVE

## 2021-03-11 LAB — D-DIMER, QUANTITATIVE: D-Dimer, Quant: 2.13 ug/mL-FEU — ABNORMAL HIGH (ref 0.00–0.50)

## 2021-03-11 LAB — LIPASE, BLOOD: Lipase: 36 U/L (ref 11–51)

## 2021-03-11 MED ORDER — IPRATROPIUM-ALBUTEROL 20-100 MCG/ACT IN AERS
1.0000 | INHALATION_SPRAY | Freq: Four times a day (QID) | RESPIRATORY_TRACT | Status: DC
  Administered 2021-03-11 – 2021-03-15 (×15): 1 via RESPIRATORY_TRACT
  Filled 2021-03-11: qty 4

## 2021-03-11 MED ORDER — METHYLPREDNISOLONE SODIUM SUCC 125 MG IJ SOLR
125.0000 mg | Freq: Once | INTRAMUSCULAR | Status: AC
  Administered 2021-03-11: 125 mg via INTRAVENOUS
  Filled 2021-03-11: qty 2

## 2021-03-11 MED ORDER — IOHEXOL 350 MG/ML SOLN
75.0000 mL | Freq: Once | INTRAVENOUS | Status: AC | PRN
  Administered 2021-03-11: 75 mL via INTRAVENOUS

## 2021-03-11 MED ORDER — MENTHOL 3 MG MT LOZG
1.0000 | LOZENGE | OROMUCOSAL | Status: DC | PRN
  Administered 2021-03-12: 3 mg via ORAL
  Filled 2021-03-11 (×3): qty 9

## 2021-03-11 MED ORDER — ENOXAPARIN SODIUM 40 MG/0.4ML IJ SOSY
40.0000 mg | PREFILLED_SYRINGE | INTRAMUSCULAR | Status: DC
  Administered 2021-03-11 – 2021-03-14 (×4): 40 mg via SUBCUTANEOUS
  Filled 2021-03-11 (×4): qty 0.4

## 2021-03-11 MED ORDER — ALBUTEROL SULFATE HFA 108 (90 BASE) MCG/ACT IN AERS
6.0000 | INHALATION_SPRAY | Freq: Once | RESPIRATORY_TRACT | Status: AC
  Administered 2021-03-11: 6 via RESPIRATORY_TRACT
  Filled 2021-03-11: qty 6.7

## 2021-03-11 MED ORDER — METHYLPREDNISOLONE SODIUM SUCC 40 MG IJ SOLR
40.0000 mg | Freq: Two times a day (BID) | INTRAMUSCULAR | Status: AC
  Administered 2021-03-11 – 2021-03-14 (×6): 40 mg via INTRAVENOUS
  Filled 2021-03-11 (×6): qty 1

## 2021-03-11 MED ORDER — ZINC SULFATE 220 (50 ZN) MG PO CAPS
220.0000 mg | ORAL_CAPSULE | Freq: Every day | ORAL | Status: DC
  Administered 2021-03-11 – 2021-03-15 (×5): 220 mg via ORAL
  Filled 2021-03-11 (×5): qty 1

## 2021-03-11 MED ORDER — SODIUM CHLORIDE 0.9 % IV SOLN
100.0000 mg | Freq: Every day | INTRAVENOUS | Status: AC
  Administered 2021-03-12 – 2021-03-15 (×4): 100 mg via INTRAVENOUS
  Filled 2021-03-11 (×4): qty 20

## 2021-03-11 MED ORDER — SODIUM CHLORIDE 0.9 % IV SOLN
200.0000 mg | Freq: Once | INTRAVENOUS | Status: AC
  Administered 2021-03-11: 200 mg via INTRAVENOUS
  Filled 2021-03-11: qty 40

## 2021-03-11 MED ORDER — IPRATROPIUM BROMIDE HFA 17 MCG/ACT IN AERS
2.0000 | INHALATION_SPRAY | Freq: Once | RESPIRATORY_TRACT | Status: AC
  Administered 2021-03-11: 2 via RESPIRATORY_TRACT
  Filled 2021-03-11: qty 12.9

## 2021-03-11 MED ORDER — ONDANSETRON HCL 4 MG/2ML IJ SOLN: 4.0000 mg | Freq: Four times a day (QID) | INTRAMUSCULAR | Status: DC | PRN

## 2021-03-11 MED ORDER — ACETAMINOPHEN 325 MG PO TABS
650.0000 mg | ORAL_TABLET | Freq: Once | ORAL | Status: AC
  Administered 2021-03-11: 650 mg via ORAL
  Filled 2021-03-11: qty 2

## 2021-03-11 MED ORDER — ONDANSETRON HCL 4 MG PO TABS: 4.0000 mg | ORAL_TABLET | Freq: Four times a day (QID) | ORAL | Status: DC | PRN

## 2021-03-11 MED ORDER — HYDROCOD POLST-CPM POLST ER 10-8 MG/5ML PO SUER
5.0000 mL | Freq: Two times a day (BID) | ORAL | Status: DC | PRN
  Administered 2021-03-12: 5 mL via ORAL
  Filled 2021-03-11: qty 5

## 2021-03-11 MED ORDER — GUAIFENESIN-DM 100-10 MG/5ML PO SYRP
10.0000 mL | ORAL_SOLUTION | ORAL | Status: DC | PRN
  Administered 2021-03-12 – 2021-03-13 (×5): 10 mL via ORAL
  Filled 2021-03-11 (×5): qty 10

## 2021-03-11 MED ORDER — ACETAMINOPHEN 325 MG PO TABS
650.0000 mg | ORAL_TABLET | Freq: Four times a day (QID) | ORAL | Status: DC | PRN
  Administered 2021-03-11 – 2021-03-12 (×2): 650 mg via ORAL
  Filled 2021-03-11 (×3): qty 2

## 2021-03-11 MED ORDER — PREDNISONE 50 MG PO TABS
50.0000 mg | ORAL_TABLET | Freq: Every day | ORAL | Status: DC
  Administered 2021-03-15: 50 mg via ORAL
  Filled 2021-03-11: qty 1

## 2021-03-11 MED ORDER — ASCORBIC ACID 500 MG PO TABS
500.0000 mg | ORAL_TABLET | Freq: Every day | ORAL | Status: DC
  Administered 2021-03-11 – 2021-03-15 (×5): 500 mg via ORAL
  Filled 2021-03-11 (×5): qty 1

## 2021-03-11 NOTE — ED Notes (Signed)
Son would like the nurse to call him and give him an update on mom's plan of care, [phone].

## 2021-03-11 NOTE — ED Triage Notes (Signed)
Pt reports cough, generalized body aches, and chills that began 2 days ago. Pt reports trouble breathing that began this morning. Pt denies being vaccinated for COVID. Pt also reports hx of COPD.

## 2021-03-11 NOTE — Plan of Care (Signed)

## 2021-03-11 NOTE — ED Notes (Signed)
Call received from pt son Reuel Cleopatra Cedar [phone] requesting rtn call for pt status/updates. [employer]

## 2021-03-11 NOTE — ED Provider Notes (Signed)
[facility]-EMERGENCY DEPT Provider Note   CSN: [id number] Arrival date & time: 03/11/21  1130     History Chief Complaint  Patient presents with  . Shortness of Breath    Nancy Farmer is a 65 y.o. female.  The history is provided by the patient.  Shortness of Breath Severity:  Moderate Onset quality:  Gradual Duration:  2 days Timing:  Constant Progression:  Unchanged Chronicity:  New Context: URI (sore throat, fever, body aches)   Relieved by:  Nothing Worsened by:  Exertion Associated symptoms: cough and fever   Associated symptoms: no abdominal pain, no chest pain, no claudication, no ear pain, no rash, no sore throat and no vomiting        Past Medical History:  Diagnosis Date  . Bronchitis   . COPD (chronic obstructive pulmonary disease) (HCC)   . High cholesterol     Patient Active Problem List   Diagnosis Date Noted  . COVID-19 03/11/2021  . COPD exacerbation (HCC) 08/18/2014  . Acute respiratory failure with hypoxemia (HCC) 08/18/2014  . Tobacco abuse 08/18/2014  . Atypical chest pain 08/18/2014    Past Surgical History:  Procedure Laterality Date  . LAZER CONIZATION    . TONSILLECTOMY       OB History   No obstetric history on file.     History reviewed. No pertinent family history.  Social History   Tobacco Use  . Smoking status: Former Smoker    Types: Cigarettes  . Smokeless tobacco: Never Used  Substance Use Topics  . Alcohol use: No    Alcohol/week: 0.0 standard drinks  . Drug use: No    Home Medications Prior to Admission medications   Medication Sig Start Date End Date Taking? Authorizing Provider  aspirin-sod bicarb-citric acid (ALKA-SELTZER) 325 MG TBEF tablet Take 325 mg by mouth every 6 (six) hours as needed (congestion).   Yes [provider]  dextromethorphan-guaiFENesin (MUCINEX DM) 30-600 MG 12hr tablet Take 1 tablet by mouth 2 (two) times daily as needed for cough.   Yes [provider]  ibuprofen (ADVIL) 200 MG tablet Take 600 mg by mouth every 6 (six) hours as needed for fever, headache or mild pain.   Yes [provider]    Allergies    Patient has no known allergies.  Review of Systems   Review of Systems  Constitutional: Positive for fever. Negative for chills.  HENT: Negative for ear pain and sore throat.   Eyes: Negative for pain and visual disturbance.  Respiratory: Positive for cough and shortness of breath.   Cardiovascular: Negative for chest pain, palpitations and claudication.  Gastrointestinal: Negative for abdominal pain and vomiting.  Genitourinary: Negative for dysuria and hematuria.  Musculoskeletal: Negative for arthralgias and back pain.  Skin: Negative for color change and rash.  Neurological: Negative for seizures and syncope.  All other systems reviewed and are negative.   Physical Exam Updated Vital Signs  ED Triage Vitals  Enc Vitals Group     BP 03/11/21 1140 (!) 150/117     Pulse Rate 03/11/21 1140 (!) 109     Resp 03/11/21 1140 (!) 24     Temp 03/11/21 1140 100.3 F (37.9 C)     Temp Source 03/11/21 1140 Oral     SpO2 03/11/21 1140 98 %     Weight --      Height --      Head Circumference --      Peak Flow --  Pain Score 03/11/21 1138 10     Pain Loc --      Pain Edu? --      Excl. in GC? --     Physical Exam Vitals and nursing note reviewed.  Constitutional:      General: She is not in acute distress.    Appearance: She is well-developed. She is ill-appearing.  HENT:     Head: Normocephalic and atraumatic.  Eyes:     Conjunctiva/sclera: Conjunctivae normal.     Pupils: Pupils are equal, round, and reactive to light.  Cardiovascular:     Rate and Rhythm: Normal rate and regular rhythm.     Pulses: Normal pulses.     Heart sounds: Normal heart sounds. No murmur heard.   Pulmonary:     Effort: Tachypnea present. No respiratory distress.     Breath sounds: Decreased breath sounds and  wheezing present.  Abdominal:     Palpations: Abdomen is soft.     Tenderness: There is no abdominal tenderness.  Musculoskeletal:     Cervical back: Normal range of motion and neck supple.     Right lower leg: No edema.     Left lower leg: No edema.  Skin:    General: Skin is warm and dry.     Capillary Refill: Capillary refill takes less than 2 seconds.  Neurological:     General: No focal deficit present.     Mental Status: She is alert.     ED Results / Procedures / Treatments   Labs (all labs ordered are listed, but only abnormal results are displayed) Labs Reviewed  RESP PANEL BY RT-PCR (FLU A&B, COVID) ARPGX2 - Abnormal; Notable for the following components:      Result Value   SARS Coronavirus 2 by RT PCR POSITIVE (*)    All other components within normal limits  COMPREHENSIVE METABOLIC PANEL - Abnormal; Notable for the following components:   Glucose, Bld 110 (*)    AST 52 (*)    ALT 58 (*)    All other components within normal limits  CBC WITH DIFFERENTIAL/PLATELET - Abnormal; Notable for the following components:   Platelets 139 (*)    All other components within normal limits  D-DIMER, QUANTITATIVE - Abnormal; Notable for the following components:   D-Dimer, Quant 2.13 (*)    All other components within normal limits  GROUP A STREP BY PCR  CULTURE, BLOOD (SINGLE)  URINE CULTURE  LACTIC ACID, PLASMA  LIPASE, BLOOD  FIBRINOGEN  LACTIC ACID, PLASMA  URINALYSIS, ROUTINE W REFLEX MICROSCOPIC  PROCALCITONIN  LACTATE DEHYDROGENASE  FERRITIN  TRIGLYCERIDES  C-REACTIVE PROTEIN    EKG EKG Interpretation  Date/Time:  Thursday March 11 2021 11:42:38 EDT Ventricular Rate:  103 PR Interval:  140 QRS Duration: 68 QT Interval:  354 QTC Calculation: 463 R Axis:   -4 Text Interpretation: Sinus tachycardia Septal infarct , age undetermined Abnormal ECG Confirmed by Virgina Norfolk [phone]) on 03/11/2021 12:27:56 PM   Radiology DG Chest Portable 1 View  Result  Date: 03/11/2021 CLINICAL DATA:  Shortness of breath, body aches EXAM: PORTABLE CHEST 1 VIEW COMPARISON:  11/11/2015 FINDINGS: Heart size is within normal limits. Mildly increased interstitial markings within the lung bases, right greater than left. No appreciable pleural fluid collection. No pneumothorax. IMPRESSION: Mildly increased interstitial markings within the lung bases, right greater than left, which could reflect mild interstitial edema versus atypical/viral infection. Electronically Signed   By: Duanne Guess D.O.   On: 03/11/2021  12:27    Procedures .Critical Care Performed by: Virgina Norfolk, DO Authorized by: Virgina Norfolk, DO   Critical care provider statement:    Critical care time (minutes):  35   Critical care was necessary to treat or prevent imminent or life-threatening deterioration of the following conditions:  Respiratory failure   Critical care was time spent personally by me on the following activities:  Blood draw for specimens, development of treatment plan with patient or surrogate, discussions with primary provider, evaluation of patient's response to treatment, examination of patient, obtaining history from patient or surrogate, ordering and performing treatments and interventions, ordering and review of laboratory studies, ordering and review of radiographic studies, pulse oximetry, re-evaluation of patient's condition and review of old charts   I assumed direction of critical care for this patient from another provider in my specialty: no     Care discussed with: admitting provider       Medications Ordered in ED Medications  remdesivir 200 mg in sodium chloride 0.9% 250 mL IVPB (has no administration in time range)    Followed by  remdesivir 100 mg in sodium chloride 0.9 % 100 mL IVPB (has no administration in time range)  albuterol (VENTOLIN HFA) 108 (90 Base) MCG/ACT inhaler 6 puff (6 puffs Inhalation Given 03/11/21 1314)  ipratropium (ATROVENT HFA)  inhaler 2 puff (2 puffs Inhalation Given 03/11/21 1314)  methylPREDNISolone sodium succinate (SOLU-MEDROL) 125 mg/2 mL injection 125 mg (125 mg Intravenous Given 03/11/21 1313)  acetaminophen (TYLENOL) tablet 650 mg (650 mg Oral Given 03/11/21 1314)    ED Course  I have reviewed the triage vital signs and the nursing notes.  Pertinent labs & imaging results that were available during my care of the patient were reviewed by me and considered in my medical decision making (see chart for details).    MDM Rules/Calculators/A&P                          Stephanny Tsutsui is here with shortness of breath.  History of COPD.  Fever, hypoxia.  Not vaccinated against COVID.  COVID-positive.  Chest x-ray consistent with COVID-pneumonia.  No leukocytosis or lactic acidosis.  Strep test negative.  Likely emergency patient COVID causing COPD exacerbation and hypoxia.  Given steroids, remdesivir, breathing treatments.  Will admit to medicine for further COVID care. Stable on 2L of oxygen. Hypoxic to mid 80s on room air. Pt with new oxygen requirement in the setting of COVID.  This chart was dictated using voice recognition software.  Despite best efforts to proofread,  errors can occur which can change the documentation meaning.    Final Clinical Impression(s) / ED Diagnoses Final diagnoses:  COVID-19  COPD exacerbation (HCC)  Acute respiratory failure with hypoxia [employer] [facility])    Rx / DC Orders ED Discharge Orders    None       Virgina Norfolk, DO 03/11/21 1527

## 2021-03-11 NOTE — H&P (Addendum)
History and Physical    Nancy Farmer UDJ:[id number] DOB: 05-Sep-1956 DOA: 03/11/2021  PCP: Gweneth Dimitri, MD  Patient coming from: Home  Chief Complaint: cough and sore throat  HPI: Nancy Farmer is a 65 y.o. female with medical history significant of COPD. Presenting with cough and sore throat. She reports that he symptoms began about 4 days ago. She had a productive cough. She has a history of COPD, so she thought she might be starting a flare. She tried some mucinex DM to help, but it didn't. 2 days ago she started having sore throat. She tried ibuprofen, but it didn't help. Her symptoms progressed through this morning and included body aches. She decided to come to the ED for help. She denies any other aggravating or alleviating factors.     ED Course: She was found to be slightly hypoxic and short of breath. She was started on 2L [place]. CXR showed possible interstitial edema vs atypical/viral infection. She was found to be COVID positive. She was started on remdesivir and steroids. TRH was called for admission.    Review of Systems:  Denies chest pain, palpitations, N/V/D, fevers, sick contacts. She is not vaccinated. Review of systems is otherwise negative for all not mentioned in HPI.   PMHx Past Medical History:  Diagnosis Date  . Bronchitis   . COPD (chronic obstructive pulmonary disease) (HCC)   . High cholesterol     PSHx Past Surgical History:  Procedure Laterality Date  . LAZER CONIZATION    . TONSILLECTOMY      SocHx  reports that she has quit smoking. Her smoking use included cigarettes. She has never used smokeless tobacco. She reports that she does not drink alcohol and does not use drugs.  No Known Allergies  FamHx History reviewed. No pertinent family history.  Prior to Admission medications   Medication Sig Start Date End Date Taking? Authorizing Provider  aspirin-sod bicarb-citric acid (ALKA-SELTZER) 325 MG TBEF tablet Take 325 mg by mouth every 6  (six) hours as needed (congestion).   Yes [provider]  dextromethorphan-guaiFENesin (MUCINEX DM) 30-600 MG 12hr tablet Take 1 tablet by mouth 2 (two) times daily as needed for cough.   Yes [provider]  ibuprofen (ADVIL) 200 MG tablet Take 600 mg by mouth every 6 (six) hours as needed for fever, headache or mild pain.   Yes [provider]    Physical Exam: Vitals:   03/11/21 1300 03/11/21 1330 03/11/21 1400 03/11/21 1430  BP: 132/76 (!) 163/70 (!) 151/98 (!) 125/105  Pulse: 96 94 (!) 102 (!) 103  Resp: 18  18 17   Temp:      TempSrc:      SpO2: 92% (!) 89% 90% 95%    General: 64 y.o. female resting in bed in NAD Eyes: PERRL, normal sclera ENMT: Nares patent w/o discharge, orophaynx clear, dentition normal, ears w/o discharge/lesions/ulcers Neck: Supple, trachea midline Cardiovascular: RRR, +S1, S2, no m/g/r, equal pulses throughout Respiratory: exp wheeze noted b/l, slightly increased WOB on 2L [place] GI: BS+, NDNT, no masses noted, no organomegaly noted MSK: No e/c/c Skin: No rashes, bruises, ulcerations noted Neuro: A&O x 3, no focal deficits Psyc: Appropriate interaction and affect, calm/cooperative  Labs on Admission: I have personally reviewed following labs and imaging studies  CBC: Recent Labs  Lab 03/11/21 1204  WBC 6.3  NEUTROABS 4.8  HGB 13.8  HCT 43.6  MCV 95.2  PLT 139*   Basic Metabolic Panel: Recent Labs  Lab 03/11/21  1204  NA 138  K 4.1  CL 103  CO2 25  GLUCOSE 110*  BUN 12  CREATININE 0.66  CALCIUM 9.1   GFR: CrCl cannot be calculated (Unknown ideal weight.). Liver Function Tests: Recent Labs  Lab 03/11/21 1204  AST 52*  ALT 58*  ALKPHOS 102  BILITOT 0.8  PROT 8.1  ALBUMIN 4.3   Recent Labs  Lab 03/11/21 1204  LIPASE 36   No results for input(s): AMMONIA in the last 168 hours. Coagulation Profile: No results for input(s): INR, PROTIME in the last 168 hours. Cardiac Enzymes: No results for  input(s): CKTOTAL, CKMB, CKMBINDEX, TROPONINI in the last 168 hours. BNP (last 3 results) No results for input(s): PROBNP in the last 8760 hours. HbA1C: No results for input(s): HGBA1C in the last 72 hours. CBG: No results for input(s): GLUCAP in the last 168 hours. Lipid Profile: No results for input(s): CHOL, HDL, LDLCALC, TRIG, CHOLHDL, LDLDIRECT in the last 72 hours. Thyroid Function Tests: No results for input(s): TSH, T4TOTAL, FREET4, T3FREE, THYROIDAB in the last 72 hours. Anemia Panel: No results for input(s): VITAMINB12, FOLATE, FERRITIN, TIBC, IRON, RETICCTPCT in the last 72 hours. Urine analysis:    Component Value Date/Time   COLORURINE YELLOW 08/28/2014 1246   APPEARANCEUR CLEAR 08/28/2014 1246   LABSPEC 1.007 08/28/2014 1246   PHURINE 5.0 08/28/2014 1246   GLUCOSEU NEG 08/28/2014 1246   HGBUR NEG 08/28/2014 1246   BILIRUBINUR NEG 08/28/2014 1246   KETONESUR NEG 08/28/2014 1246   PROTEINUR NEG 08/28/2014 1246   UROBILINOGEN 0.2 08/28/2014 1246   NITRITE NEG 08/28/2014 1246   LEUKOCYTESUR NEG 08/28/2014 1246    Radiological Exams on Admission: DG Chest Portable 1 View  Result Date: 03/11/2021 CLINICAL DATA:  Shortness of breath, body aches EXAM: PORTABLE CHEST 1 VIEW COMPARISON:  11/11/2015 FINDINGS: Heart size is within normal limits. Mildly increased interstitial markings within the lung bases, right greater than left. No appreciable pleural fluid collection. No pneumothorax. IMPRESSION: Mildly increased interstitial markings within the lung bases, right greater than left, which could reflect mild interstitial edema versus atypical/viral infection. Electronically Signed   By: Duanne Guess D.O.   On: 03/11/2021 12:27    EKG: Independently reviewed. Sinus tach, no st elevations  Assessment/Plan COVID 19 PNA Hx of COPD     - admit to inpt, tele     - steroids, remdes, guaifenesin, IS, FV, combivent     - wean O2 as able     - follow inflammatory  markers  Elevated D-dimer     - most likely as a fxn of above     - CTA PE ordered; follow  Elevated LFTs     - most likely as a function of above     - check hep panel, rpt labs in AM  DVT prophylaxis: lovenox  Code Status: FULL  Family Communication: None at bedside.  Consults called: None   Status is: Inpatient  Remains inpatient appropriate because:Inpatient level of care appropriate due to severity of illness   Dispo: The patient is from: Home              Anticipated d/c is to: Home              Patient currently is not medically stable to d/c.   Difficult to place patient No  Time spent coordinating admission: 75 minutes  Desmond Tufano A Paisly [place] DO [facility]  If 7PM-7AM, please contact night-coverage [email or web address]  03/11/2021, 2:47 PM

## 2021-03-12 DIAGNOSIS — U071 COVID-19: Secondary | ICD-10-CM | POA: Diagnosis not present

## 2021-03-12 DIAGNOSIS — J1282 Pneumonia due to coronavirus disease 2019: Secondary | ICD-10-CM | POA: Diagnosis not present

## 2021-03-12 DIAGNOSIS — J9601 Acute respiratory failure with hypoxia: Secondary | ICD-10-CM | POA: Diagnosis not present

## 2021-03-12 DIAGNOSIS — J449 Chronic obstructive pulmonary disease, unspecified: Secondary | ICD-10-CM

## 2021-03-12 LAB — C-REACTIVE PROTEIN: CRP: 9 mg/dL — ABNORMAL HIGH (ref <1.0)

## 2021-03-12 LAB — CBC WITH DIFFERENTIAL/PLATELET
Abs Immature Granulocytes: 0.03 10*3/uL (ref 0.00–0.07)
Basophils Absolute: 0 10*3/uL (ref 0.0–0.1)
Basophils Relative: 0 %
Eosinophils Absolute: 0 10*3/uL (ref 0.0–0.5)
Eosinophils Relative: 0 %
HCT: 40.1 % (ref 36.0–46.0)
Hemoglobin: 12.9 g/dL (ref 12.0–15.0)
Immature Granulocytes: 1 %
Lymphocytes Relative: 12 %
Lymphs Abs: 0.7 10*3/uL (ref 0.7–4.0)
MCH: 30.6 pg (ref 26.0–34.0)
MCHC: 32.2 g/dL (ref 30.0–36.0)
MCV: 95 fL (ref 80.0–100.0)
Monocytes Absolute: 0.3 10*3/uL (ref 0.1–1.0)
Monocytes Relative: 5 %
Neutro Abs: 5 10*3/uL (ref 1.7–7.7)
Neutrophils Relative %: 82 %
Platelets: 152 10*3/uL (ref 150–400)
RBC: 4.22 MIL/uL (ref 3.87–5.11)
RDW: 13.3 % (ref 11.5–15.5)
WBC: 6 10*3/uL (ref 4.0–10.5)
nRBC: 0 % (ref 0.0–0.2)

## 2021-03-12 LAB — MAGNESIUM: Magnesium: 2.1 mg/dL (ref 1.7–2.4)

## 2021-03-12 LAB — COMPREHENSIVE METABOLIC PANEL
ALT: 48 U/L — ABNORMAL HIGH (ref 0–44)
AST: 37 U/L (ref 15–41)
Albumin: 4.2 g/dL (ref 3.5–5.0)
Alkaline Phosphatase: 87 U/L (ref 38–126)
Anion gap: 11 (ref 5–15)
BUN: 13 mg/dL (ref 8–23)
CO2: 25 mmol/L (ref 22–32)
Calcium: 8.9 mg/dL (ref 8.9–10.3)
Chloride: 100 mmol/L (ref 98–111)
Creatinine, Ser: 0.62 mg/dL (ref 0.44–1.00)
GFR, Estimated: >60 mL/min (ref >60)
Glucose, Bld: 183 mg/dL — ABNORMAL HIGH (ref 70–99)
Potassium: 4 mmol/L (ref 3.5–5.1)
Sodium: 136 mmol/L (ref 135–145)
Total Bilirubin: 0.6 mg/dL (ref 0.3–1.2)
Total Protein: 7.9 g/dL (ref 6.5–8.1)

## 2021-03-12 LAB — FERRITIN: Ferritin: 106 ng/mL (ref 11–307)

## 2021-03-12 LAB — PHOSPHORUS: Phosphorus: 3.5 mg/dL (ref 2.5–4.6)

## 2021-03-12 LAB — HEPATITIS PANEL, ACUTE
HCV Ab: NONREACTIVE
Hep A IgM: NONREACTIVE
Hep B C IgM: NONREACTIVE
Hepatitis B Surface Ag: NONREACTIVE

## 2021-03-12 LAB — D-DIMER, QUANTITATIVE: D-Dimer, Quant: 2.01 ug/mL-FEU — ABNORMAL HIGH (ref 0.00–0.50)

## 2021-03-12 MED ORDER — ORAL CARE MOUTH RINSE
15.0000 mL | Freq: Two times a day (BID) | OROMUCOSAL | Status: DC
  Administered 2021-03-12 – 2021-03-14 (×5): 15 mL via OROMUCOSAL

## 2021-03-12 NOTE — Progress Notes (Signed)
Attempted to wean o2. Sats noted to be fluctuating between 86-90%. Placed back on 1L of nasal cannula, with o2 quickly returning to upper 90s. Will continue to monitor.

## 2021-03-12 NOTE — Progress Notes (Signed)
PROGRESS NOTE  Nancy Farmer [id number] DOB: 1956-04-12 DOA: 03/11/2021 PCP: Gweneth Dimitri, MD  Brief History   65 year old woman PMH COPD not on oxygen presenting with several day history of cough and sore throat.  Admitted for acute hypoxic respiratory failure secondary to COVID-19 pneumonia complicated by underlying COPD.  A & P  Acute hypoxic respiratory failure secondary to COVID-pneumonia complicated by COPD. --Appears relatively stable.  Continue oxygen, remdesivir, steroids.  Daily labs. --Oxygen requirement 2 L --Remdesivir 4/28 > --Steroids 4/28 > --Consents to baricitinib but not currently indicated --CRP 5 > 9  COPD --Appears stable at this time.  Continue bronchodilators.  Mild elevation in transaminases --Probably secondary to COVID.  AST has normalized.  ALT only minimally elevated. --CMP in AM.  A few scattered focal nodular areas of infiltration with slight peripheral infiltration in the lung bases. Findings likely to represent early multifocal pneumonia. Non-contrast chest CT at 3-6 months is recommended.  Disposition Plan:  Discussion:   Status is: Inpatient  Remains inpatient appropriate because:IV treatments appropriate due to intensity of illness or inability to take PO and Inpatient level of care appropriate due to severity of illness   Dispo: The patient is from: Home              Anticipated d/c is to: Home              Patient currently is not medically stable to d/c.   Difficult to place patient No  DVT prophylaxis: enoxaparin   Code Status: Full Code Level of care: Med-Surg Family Communication: none  Brendia Sacks, MD  [facility] Direct contact: see [email or web address] (further directions at bottom of note if needed) 7PM-7AM contact night coverage as at bottom of note 03/12/2021, 4:14 PM  LOS: 1 day   Significant Hospital Events   . 4/29 admit for COVID pneumonia   Consults:  . None    Procedures:  . None   Significant  Diagnostic Tests:  . 4/28 CTA chest no PE. Early multifocal pneumonia    Micro Data:  . 4/28 BC > . COVID+   Antimicrobials:  . None   Interval History/Subjective  CC: f/u COVID  Reports significant coughing.  Breathing okay.  Not on oxygen at home.  Objective   Vitals:  Vitals:   03/12/21 0800 03/12/21 1236  BP: 119/68 (!) 128/52  Pulse: 74 72  Resp: 14 16  Temp: 98 F (36.7 C) 98.8 F (37.1 C)  SpO2: 96% 93%    Exam:  Constitutional:   . Appears calm, mildly uncomfortable, nontoxic ENMT:  . grossly normal hearing  Respiratory:  . CTA bilaterally, fair air movement.  Mild coarse breath sounds. Marland Kitchen Respiratory effort mildly increased Cardiovascular:  . RRR, no m/r/g . No LE extremity edema   . Normal pedal pulses Psychiatric:  . Mental status o Mood, affect appropriate  I have personally reviewed the following:   Today's Data  . Complete metabolic panel notable for mild elevation of ALT 48, AST is normalized. . Ferritin normal 106, CRP up to 9.0, lactic acid and procalcitonin normal on admission, CBC unremarkable, D-dimer slightly improved 2.01  Scheduled Meds: . vitamin C  500 mg Oral Daily  . enoxaparin (LOVENOX) injection  40 mg Subcutaneous Q24H  . Ipratropium-Albuterol  1 puff Inhalation Q6H  . mouth rinse  15 mL Mouth Rinse BID  . methylPREDNISolone (SOLU-MEDROL) injection  40 mg Intravenous Q12H   Followed by  . [START ON 03/15/2021] predniSONE  50 mg  Oral Daily  . zinc sulfate  220 mg Oral Daily   Continuous Infusions: . remdesivir 100 mg in NS 100 mL 100 mg (03/12/21 6270)    Principal Problem:   Acute respiratory failure with hypoxemia (HCC) Active Problems:   Pneumonia due to COVID-19 virus   LOS: 1 day   How to contact the [facility] Attending or Consulting provider 7A - 7P or covering provider during after hours 7P -7A, for this patient?  1. Check the care team in [facility] and look for a) attending/consulting TR[facility] provider listed and b) the [facility]  team listed 2. Log into [email or web address] and use [place]'s universal password to access. If you do not have the password, please contact the hospital operator. 3. Locate the [facility] provider you are looking for under [facility] and page to a number that you can be directly reached. 4. If you still have difficulty reaching the provider, please page the [facility] (Director on Call) for the Hospitalists listed on amion for assistance.

## 2021-03-12 NOTE — Plan of Care (Signed)

## 2021-03-12 NOTE — Hospital Course (Addendum)
65 year old woman PMH COPD not on oxygen presenting with several day history of cough and sore throat.  Admitted for acute hypoxic respiratory failure secondary to COVID-19 pneumonia complicated by underlying COPD.  A & P  Acute hypoxic respiratory failure secondary to COVID-pneumonia complicated by COPD. --Improving.  Wean oxygen.  Continue remdesivir, steroids. --CRP continues to trend down.  D-dimer trending down --Wean oxygen today if possible, if not, set up home oxygen --Oxygen requirement now 1 L --Remdesivir 4/28 > 5/2 --Steroids 4/28 >  -- Consented to baricitinib but not currently indicated --CRP 5 > 9 > 4.6 > 2.0  COPD -- remains stable.  Continue bronchodilators.  Mild elevation in transaminases --Resolved.  Secondary to COVID.

## 2021-03-13 DIAGNOSIS — J9601 Acute respiratory failure with hypoxia: Secondary | ICD-10-CM | POA: Diagnosis not present

## 2021-03-13 DIAGNOSIS — U071 COVID-19: Secondary | ICD-10-CM | POA: Diagnosis not present

## 2021-03-13 DIAGNOSIS — J1282 Pneumonia due to coronavirus disease 2019: Secondary | ICD-10-CM | POA: Diagnosis not present

## 2021-03-13 LAB — CBC WITH DIFFERENTIAL/PLATELET
Abs Immature Granulocytes: 0.03 10*3/uL (ref 0.00–0.07)
Basophils Absolute: 0 10*3/uL (ref 0.0–0.1)
Basophils Relative: 0 %
Eosinophils Absolute: 0 10*3/uL (ref 0.0–0.5)
Eosinophils Relative: 0 %
HCT: 39.3 % (ref 36.0–46.0)
Hemoglobin: 12.7 g/dL (ref 12.0–15.0)
Immature Granulocytes: 0 %
Lymphocytes Relative: 10 %
Lymphs Abs: 1.2 10*3/uL (ref 0.7–4.0)
MCH: 30.2 pg (ref 26.0–34.0)
MCHC: 32.3 g/dL (ref 30.0–36.0)
MCV: 93.6 fL (ref 80.0–100.0)
Monocytes Absolute: 0.6 10*3/uL (ref 0.1–1.0)
Monocytes Relative: 5 %
Neutro Abs: 9.5 10*3/uL — ABNORMAL HIGH (ref 1.7–7.7)
Neutrophils Relative %: 85 %
Platelets: 177 10*3/uL (ref 150–400)
RBC: 4.2 MIL/uL (ref 3.87–5.11)
RDW: 13.4 % (ref 11.5–15.5)
WBC: 11.3 10*3/uL — ABNORMAL HIGH (ref 4.0–10.5)
nRBC: 0 % (ref 0.0–0.2)

## 2021-03-13 LAB — COMPREHENSIVE METABOLIC PANEL
ALT: 46 U/L — ABNORMAL HIGH (ref 0–44)
AST: 34 U/L (ref 15–41)
Albumin: 4.1 g/dL (ref 3.5–5.0)
Alkaline Phosphatase: 80 U/L (ref 38–126)
Anion gap: 9 (ref 5–15)
BUN: 23 mg/dL (ref 8–23)
CO2: 28 mmol/L (ref 22–32)
Calcium: 9.1 mg/dL (ref 8.9–10.3)
Chloride: 99 mmol/L (ref 98–111)
Creatinine, Ser: 0.81 mg/dL (ref 0.44–1.00)
GFR, Estimated: >60 mL/min (ref >60)
Glucose, Bld: 172 mg/dL — ABNORMAL HIGH (ref 70–99)
Potassium: 4 mmol/L (ref 3.5–5.1)
Sodium: 136 mmol/L (ref 135–145)
Total Bilirubin: 0.5 mg/dL (ref 0.3–1.2)
Total Protein: 7.9 g/dL (ref 6.5–8.1)

## 2021-03-13 LAB — FERRITIN: Ferritin: 132 ng/mL (ref 11–307)

## 2021-03-13 LAB — MAGNESIUM: Magnesium: 2.2 mg/dL (ref 1.7–2.4)

## 2021-03-13 LAB — C-REACTIVE PROTEIN: CRP: 4.6 mg/dL — ABNORMAL HIGH (ref <1.0)

## 2021-03-13 LAB — D-DIMER, QUANTITATIVE: D-Dimer, Quant: 2.04 ug/mL-FEU — ABNORMAL HIGH (ref 0.00–0.50)

## 2021-03-13 LAB — PHOSPHORUS: Phosphorus: 3.7 mg/dL (ref 2.5–4.6)

## 2021-03-13 NOTE — Progress Notes (Signed)
Shift Summary:   Uneventful shift, remains alert and oriented, encouraged to ambulate and use spirometer, was able to get up to 1250. Telemetry discontinued by MD. Latina Craver on continuous pulse ox. Weaned oxygen to 1L during this shift. Sats anywhere from 90-93%.  Vital signs stable during this shift. Has PRN Robitussin available for cough, last received at 1639. No other needs identified. Will continue to monitor.

## 2021-03-13 NOTE — Progress Notes (Signed)
PROGRESS NOTE  Nancy Farmer [id number]:[id number] DOB: 1956-05-03 DOA: 03/11/2021 PCP: Gweneth Dimitri, MD  Brief History   65 year old woman PMH COPD not on oxygen presenting with several day history of cough and sore throat.  Admitted for acute hypoxic respiratory failure secondary to COVID-19 pneumonia complicated by underlying COPD.  A & P  Acute hypoxic respiratory failure secondary to COVID-pneumonia complicated by COPD. -- Subjectively improved.  Continue oxygen, remdesivir, steroids. --CRP down.  D-dimer stable. --Continues to improve, may be able to go home 1-2 days. --Wean oxygen as tolerated. --Oxygen requirement 2 L --Remdesivir 4/28 > --Steroids 4/28 > -- Consented to baricitinib but not currently indicated --CRP 5 > 9 > 4.6  COPD -- Stable.  Continue bronchodilators.  Mild elevation in transaminases --Probably secondary to COVID.  AST normalized.  ALT only minimally elevated and trending down.  Hepatitis panel negative.. -- Follow-up as an outpatient.  A few scattered focal nodular areas of infiltration with slight peripheral infiltration in the lung bases. Findings likely to represent early multifocal pneumonia. Non-contrast chest CT at 3-6 months is recommended.  Disposition Plan:  Discussion: improving, home 1-2 days  Status is: Inpatient  Remains inpatient appropriate because:IV treatments appropriate due to intensity of illness or inability to take PO and Inpatient level of care appropriate due to severity of illness   Dispo: The patient is from: Home              Anticipated d/c is to: Home              Patient currently is not medically stable to d/c.   Difficult to place patient No  DVT prophylaxis: enoxaparin   Code Status: Full Code Level of care: Med-Surg Family Communication: none  Brendia Sacks, MD  [facility] Direct contact: see [email or web address] (further directions at bottom of note if needed) 7PM-7AM contact night coverage as at bottom  of note 03/13/2021, 3:07 PM  LOS: 2 days   Significant Hospital Events   . 4/29 admit for COVID pneumonia   Consults:  . None    Procedures:  . None   Significant Diagnostic Tests:  . 4/28 CTA chest no PE. Early multifocal pneumonia    Micro Data:  . 4/28 BC > . COVID+   Antimicrobials:  . None   Interval History/Subjective  CC: f/u COVID  With coughing today.  Breathing better.  Eating well.  Steroids have improved appetite.  Objective   Vitals:  Vitals:   03/13/21 0800 03/13/21 1459  BP:  123/63  Pulse:  81  Resp:  20  Temp:  98.9 F (37.2 C)  SpO2: 98% 92%    Exam:  Constitutional:   . Appears calm and comfortable ENMT:  . grossly normal hearing  Respiratory:  . CTA bilaterally, no w/r/r.  . Respiratory effort normal.  Cardiovascular:  . RRR, no m/r/g . Telemetry SR . No LE extremity edema   Psychiatric:  . Mental status o Mood, affect appropriate  I have personally reviewed the following:   Today's Data  . CMP, Mg, Phos noted. ALT trending down . CRP down to 4.6 . Ferritin up a bit to 132 . CBC unremarkable . Ddimer stable  Scheduled Meds: . vitamin C  500 mg Oral Daily  . enoxaparin (LOVENOX) injection  40 mg Subcutaneous Q24H  . Ipratropium-Albuterol  1 puff Inhalation Q6H  . mouth rinse  15 mL Mouth Rinse BID  . methylPREDNISolone (SOLU-MEDROL) injection  40 mg Intravenous Q12H   Followed  by  . Melene Muller ON 03/15/2021] predniSONE  50 mg Oral Daily  . zinc sulfate  220 mg Oral Daily   Continuous Infusions: . remdesivir 100 mg in NS 100 mL 100 mg (03/13/21 1041)    Principal Problem:   Acute respiratory failure with hypoxemia (HCC) Active Problems:   COPD (chronic obstructive pulmonary disease) (HCC)   Pneumonia due to COVID-19 virus   LOS: 2 days   How to contact the [facility] Attending or Consulting provider 7A - 7P or covering provider during after hours 7P -7A, for this patient?  1. Check the care team in [facility] and look for a)  attending/consulting TR[facility] provider listed and b) the [facility] team listed 2. Log into [email or web address] and use [place]'s universal password to access. If you do not have the password, please contact the hospital operator. 3. Locate the [facility] provider you are looking for under [facility] and page to a number that you can be directly reached. 4. If you still have difficulty reaching the provider, please page the [facility] (Director on Call) for the Hospitalists listed on amion for assistance.

## 2021-03-14 DIAGNOSIS — J441 Chronic obstructive pulmonary disease with (acute) exacerbation: Secondary | ICD-10-CM | POA: Diagnosis not present

## 2021-03-14 DIAGNOSIS — J9601 Acute respiratory failure with hypoxia: Secondary | ICD-10-CM | POA: Diagnosis not present

## 2021-03-14 DIAGNOSIS — U071 COVID-19: Secondary | ICD-10-CM | POA: Diagnosis not present

## 2021-03-14 DIAGNOSIS — J1282 Pneumonia due to coronavirus disease 2019: Secondary | ICD-10-CM | POA: Diagnosis not present

## 2021-03-14 LAB — CBC WITH DIFFERENTIAL/PLATELET
Abs Immature Granulocytes: 0.03 10*3/uL (ref 0.00–0.07)
Basophils Absolute: 0 10*3/uL (ref 0.0–0.1)
Basophils Relative: 0 %
Eosinophils Absolute: 0 10*3/uL (ref 0.0–0.5)
Eosinophils Relative: 0 %
HCT: 39.5 % (ref 36.0–46.0)
Hemoglobin: 12.7 g/dL (ref 12.0–15.0)
Immature Granulocytes: 0 %
Lymphocytes Relative: 18 %
Lymphs Abs: 1.3 10*3/uL (ref 0.7–4.0)
MCH: 30.6 pg (ref 26.0–34.0)
MCHC: 32.2 g/dL (ref 30.0–36.0)
MCV: 95.2 fL (ref 80.0–100.0)
Monocytes Absolute: 0.4 10*3/uL (ref 0.1–1.0)
Monocytes Relative: 5 %
Neutro Abs: 5.3 10*3/uL (ref 1.7–7.7)
Neutrophils Relative %: 77 %
Platelets: 172 10*3/uL (ref 150–400)
RBC: 4.15 MIL/uL (ref 3.87–5.11)
RDW: 13.5 % (ref 11.5–15.5)
WBC: 7 10*3/uL (ref 4.0–10.5)
nRBC: 0 % (ref 0.0–0.2)

## 2021-03-14 LAB — COMPREHENSIVE METABOLIC PANEL
ALT: 35 U/L (ref 0–44)
AST: 25 U/L (ref 15–41)
Albumin: 3.8 g/dL (ref 3.5–5.0)
Alkaline Phosphatase: 73 U/L (ref 38–126)
Anion gap: 9 (ref 5–15)
BUN: 27 mg/dL — ABNORMAL HIGH (ref 8–23)
CO2: 30 mmol/L (ref 22–32)
Calcium: 9.1 mg/dL (ref 8.9–10.3)
Chloride: 101 mmol/L (ref 98–111)
Creatinine, Ser: 0.78 mg/dL (ref 0.44–1.00)
GFR, Estimated: >60 mL/min (ref >60)
Glucose, Bld: 166 mg/dL — ABNORMAL HIGH (ref 70–99)
Potassium: 4.3 mmol/L (ref 3.5–5.1)
Sodium: 140 mmol/L (ref 135–145)
Total Bilirubin: 0.4 mg/dL (ref 0.3–1.2)
Total Protein: 7.5 g/dL (ref 6.5–8.1)

## 2021-03-14 LAB — MAGNESIUM: Magnesium: 2.2 mg/dL (ref 1.7–2.4)

## 2021-03-14 LAB — PHOSPHORUS: Phosphorus: 3.6 mg/dL (ref 2.5–4.6)

## 2021-03-14 LAB — D-DIMER, QUANTITATIVE: D-Dimer, Quant: 1.92 ug/mL-FEU — ABNORMAL HIGH (ref 0.00–0.50)

## 2021-03-14 LAB — C-REACTIVE PROTEIN: CRP: 2 mg/dL — ABNORMAL HIGH (ref <1.0)

## 2021-03-14 LAB — FERRITIN: Ferritin: 119 ng/mL (ref 11–307)

## 2021-03-14 NOTE — Progress Notes (Signed)
PROGRESS NOTE  Nancy Farmer ZOX:[id number] DOB: 27-Apr-1956 DOA: 03/11/2021 PCP: Gweneth Dimitri, MD  Brief History   65 year old woman PMH COPD not on oxygen presenting with several day history of cough and sore throat.  Admitted for acute hypoxic respiratory failure secondary to COVID-19 pneumonia complicated by underlying COPD.  A & P  Acute hypoxic respiratory failure secondary to COVID-pneumonia complicated by COPD. --Improving.  Wean oxygen.  Continue remdesivir, steroids. --CRP continues to trend down.  D-dimer trending down --Wean oxygen today if possible, if not, set up home oxygen --Oxygen requirement now 1 L --Remdesivir 4/28 > 5/2 --Steroids 4/28 >  -- Consented to baricitinib but not currently indicated --CRP 5 > 9 > 4.6 > 2.0  COPD -- remains stable.  Continue bronchodilators.  Mild elevation in transaminases --Resolved.  Secondary to COVID.  A few scattered focal nodular areas of infiltration with slight peripheral infiltration in the lung bases. Findings likely to represent early multifocal pneumonia. Non-contrast chest CT at 3-6 months is recommended.  Disposition Plan:  Discussion: continues to improve, anticipate discharge home 5/2  Status is: Inpatient  Remains inpatient appropriate because:IV treatments appropriate due to intensity of illness or inability to take PO and Inpatient level of care appropriate due to severity of illness   Dispo: The patient is from: Home              Anticipated d/c is to: Home              Patient currently is not medically stable to d/c.   Difficult to place patient No  DVT prophylaxis: enoxaparin   Code Status: Full Code Level of care: Med-Surg Family Communication: none  Brendia Sacks, MD  [facility] Direct contact: see [email or web address] (further directions at bottom of note if needed) 7PM-7AM contact night coverage as at bottom of note 03/14/2021, 1:40 PM  LOS: 3 days   Significant Hospital Events   . 4/29  admit for COVID pneumonia   Consults:  . None    Procedures:  . None   Significant Diagnostic Tests:  . 4/28 CTA chest no PE. Early multifocal pneumonia    Micro Data:  . 4/28 BC > NGTD . COVID+   Antimicrobials:  . None   Interval History/Subjective  CC: f/u COVID  Feels better, breathing better, eating very well.  Objective   Vitals:  Vitals:   03/14/21 0637 03/14/21 1201  BP: 127/75 136/72  Pulse: 64 65  Resp: 20 20  Temp: 98.5 F (36.9 C) 99.3 F (37.4 C)  SpO2: 94% 92%    Exam:  Constitutional:   . Appears calm and comfortable ENMT:  . grossly normal hearing  Respiratory:  . CTA bilaterally, no w/r/r.  . Respiratory effort normal.  Cardiovascular:  . RRR, no m/r/g . No LE extremity edema   Psychiatric:  . Mental status o Mood, affect appropriate  I have personally reviewed the following:   Today's Data  CMP unremarkable CBC unremarkable  Scheduled Meds: . vitamin C  500 mg Oral Daily  . enoxaparin (LOVENOX) injection  40 mg Subcutaneous Q24H  . Ipratropium-Albuterol  1 puff Inhalation Q6H  . mouth rinse  15 mL Mouth Rinse BID  . [START ON 03/15/2021] predniSONE  50 mg Oral Daily  . zinc sulfate  220 mg Oral Daily   Continuous Infusions: . remdesivir 100 mg in NS 100 mL 100 mg (03/14/21 1036)    Principal Problem:   Acute respiratory failure with hypoxemia (HCC) Active Problems:  COPD (chronic obstructive pulmonary disease) (HCC)   Pneumonia due to COVID-19 virus   LOS: 3 days   How to contact the [facility] Attending or Consulting provider 7A - 7P or covering provider during after hours 7P -7A, for this patient?  1. Check the care team in [facility] and look for a) attending/consulting TR[facility] provider listed and b) the [facility] team listed 2. Log into [email or web address] and use [place]'s universal password to access. If you do not have the password, please contact the hospital operator. 3. Locate the [facility] provider you are looking for under [facility] and page to a number that you can be directly reached. 4. If you still have difficulty reaching the provider, please page the [facility] (Director on Call) for the Hospitalists listed on amion for assistance.

## 2021-03-14 NOTE — Plan of Care (Signed)
  Problem: Clinical Measurements: Goal: Will remain free from infection Outcome: Progressing Goal: Respiratory complications will improve Outcome: Progressing   Problem: Coping: Goal: Psychosocial and spiritual needs will be supported Outcome: Progressing   Problem: Respiratory: Goal: Will maintain a patent airway Outcome: Progressing Goal: Complications related to the disease process, condition or treatment will be avoided or minimized Outcome: Progressing   Problem: Education: Goal: Knowledge of General Education information will improve Description: Including pain rating scale, medication(s)/side effects and non-pharmacologic comfort measures Outcome: Completed/Met   Problem: Nutrition: Goal: Adequate nutrition will be maintained Outcome: Completed/Met   Problem: Education: Goal: Knowledge of risk factors and measures for prevention of condition will improve Outcome: Completed/Met

## 2021-03-14 NOTE — Progress Notes (Signed)
SATURATION QUALIFICATIONS: (This note is used to comply with regulatory documentation for home oxygen)  Patient Saturations on Room Air at Rest = 92%  Patient Saturations on Room Air while Ambulating = 88%  Patient Saturations on 1 Liters of oxygen while Ambulating =92%  Please briefly explain why patient needs home oxygen:

## 2021-03-15 DIAGNOSIS — U071 COVID-19: Secondary | ICD-10-CM | POA: Diagnosis not present

## 2021-03-15 DIAGNOSIS — J9601 Acute respiratory failure with hypoxia: Secondary | ICD-10-CM | POA: Diagnosis not present

## 2021-03-15 DIAGNOSIS — J1282 Pneumonia due to coronavirus disease 2019: Secondary | ICD-10-CM | POA: Diagnosis not present

## 2021-03-15 DIAGNOSIS — J449 Chronic obstructive pulmonary disease, unspecified: Secondary | ICD-10-CM | POA: Diagnosis not present

## 2021-03-15 MED ORDER — ASCORBIC ACID 500 MG PO TABS: 500.0000 mg | ORAL_TABLET | Freq: Every day | ORAL | Status: AC

## 2021-03-15 MED ORDER — ZINC SULFATE 220 (50 ZN) MG PO CAPS: 220.0000 mg | ORAL_CAPSULE | Freq: Every day | ORAL | Status: AC

## 2021-03-15 MED ORDER — LIP MEDEX EX OINT
TOPICAL_OINTMENT | CUTANEOUS | Status: DC | PRN
  Administered 2021-03-15: 1 via TOPICAL
  Filled 2021-03-15: qty 7

## 2021-03-15 MED ORDER — PREDNISONE 10 MG PO TABS: ORAL_TABLET | ORAL | 0 refills | Status: AC

## 2021-03-15 NOTE — Discharge Summary (Signed)
Physician Discharge Summary  Nancy Farmer KGM:[id number] DOB: [birth year] DOA: 03/11/2021  PCP: Gweneth Dimitri, MD  Admit date: 03/11/2021 Discharge date: 03/15/2021  Recommendations for Outpatient Follow-up:  1. Resolution of COVID, acute hypoxia, sent home new start on oxygen   Follow-up Information    [employer] Follow up.   Why: Your Oxygen is from this company. Contact information: [address]  [address], [place] [address]  Office [phone]       Gweneth Dimitri, MD. Schedule an appointment as soon as possible for a visit in 1 week(s).   Specialty: Family Medicine Contact information: [address] [place] [address] [phone]                Discharge Diagnoses: Principal diagnosis is #1 Principal Problem:   Acute respiratory failure with hypoxemia (HCC) Active Problems:   COPD (chronic obstructive pulmonary disease) (HCC)   Pneumonia due to COVID-19 virus   Discharge Condition: improved Disposition: home  Diet recommendation:  Diet Orders (From admission, onward)    Start     Ordered   03/15/21 0000  Diet general        03/15/21 1354   03/11/21 1723  Diet Heart Room service appropriate? Yes; Fluid consistency: Thin  Diet effective now       Question Answer Comment  Room service appropriate? Yes   Fluid consistency: Thin      03/11/21 1722           Filed Weights   03/11/21 1737  Weight: 96.2 kg    HPI/Hospital Course:   65 year old woman PMH COPD not on oxygen presenting with several day history of cough and sore throat.  Admitted for acute hypoxic respiratory failure secondary to COVID-19 pneumonia complicated by underlying COPD.  Treated with remdesivir and steroids with rapid clinical improvement.  Hospitalization uncomplicated but did not require home oxygen on discharge.  Acute hypoxic respiratory failure secondary to COVID-pneumonia complicated by COPD. --Remains hypoxic, go home on oxygen, however otherwise  doing well.  Completed remdesivir.  Short steroid taper as an outpatient.  COPD --Stable.  Mild elevation in transaminases --Resolved.  Secondary to COVID.  Significant Hospital Events    4/29 admit for COVID pneumonia  Consults:   None   Procedures:   None   Significant Diagnostic Tests:   4/28 CTA chest no PE. Early multifocal pneumonia   Micro Data:   4/28 BC > NGTD  COVID+  Antimicrobials:   None   Today's assessment: S: CC: f/u SOB  Feels better, breathing better  O: Vitals:  Vitals:   03/14/21 2035 03/15/21 0538  BP: 130/70 119/70  Pulse: 67 61  Resp: 16 16  Temp: 98.1 F (36.7 C) 98.4 F (36.9 C)  SpO2: 94% 95%    Constitutional:  . Appears calm and comfortable Respiratory:  . CTA bilaterally, no w/r/r.  . Respiratory effort normal.  Cardiovascular:  . RRR, no m/r/g Psychiatric:  . Mental status o Mood, affect appropriate  No new labs  Discharge Instructions  Discharge Instructions    Diet general   Complete by: As directed    Discharge instructions   Complete by: As directed    Call your physician or seek immediate medical attention for shortness of breath, pain, swelling, confusion or worsening of condition. Isolate through May 8.   Increase activity slowly   Complete by: As directed      Allergies as of 03/15/2021   No Known Allergies  Medication List    TAKE these medications   ascorbic acid 500 MG tablet Commonly known as: VITAMIN C Take 1 tablet (500 mg total) by mouth daily. Start taking on: Mar 16, 2021   aspirin-sod bicarb-citric acid 325 MG Tbef tablet Commonly known as: ALKA-SELTZER Take 325 mg by mouth every 6 (six) hours as needed (congestion).   dextromethorphan-guaiFENesin 30-600 MG 12hr tablet Commonly known as: MUCINEX DM Take 1 tablet by mouth 2 (two) times daily as needed for cough.   ibuprofen 200 MG tablet Commonly known as: ADVIL Take 600 mg by mouth every 6 (six) hours as needed for  fever, headache or mild pain.   predniSONE 10 MG tablet Commonly known as: DELTASONE Take 4 tablets (40 mg total) by mouth daily with breakfast for 2 days, THEN 2 tablets (20 mg total) daily with breakfast for 2 days, THEN 1 tablet (10 mg total) daily with breakfast for 2 days. Start taking on: Mar 15, 2021   zinc sulfate 220 (50 Zn) MG capsule Take 1 capsule (220 mg total) by mouth daily. Start taking on: Mar 16, 2021            Durable Medical Equipment  (From admission, onward)         Start     Ordered   03/15/21 0755  For home use only DME oxygen  Once       Question Answer Comment  Length of Need 6 Months   Mode or (Route) Nasal cannula   Liters per Minute 2   Frequency Continuous (stationary and portable oxygen unit needed)   Oxygen delivery system Gas      03/15/21 0754         No Known Allergies  The results of significant diagnostics from this hospitalization (including imaging, microbiology, ancillary and laboratory) are listed below for reference.    Significant Diagnostic Studies: CT ANGIO CHEST PE W OR WO CONTRAST  Result Date: 03/11/2021 CLINICAL DATA:  Pulmonary embolus suspected with low a 2 in a medial probability. Positive D-dimer. Cough and sore throat, beginning about 4 days ago. History of COPD. EXAM: CT ANGIOGRAPHY CHEST WITH CONTRAST TECHNIQUE: Multidetector CT imaging of the chest was performed using the standard protocol during bolus administration of intravenous contrast. Multiplanar CT image reconstructions and MIPs were obtained to evaluate the vascular anatomy. CONTRAST:  75mL OMNIPAQUE IOHEXOL 350 MG/ML SOLN COMPARISON:  Chest radiograph 03/11/2021 FINDINGS: Cardiovascular: Good opacification of the central and segmental pulmonary arteries. No focal filling defects. No evidence of significant pulmonary embolus. Normal heart size. No pericardial effusions. Normal caliber thoracic aorta. No aortic dissection. Mild calcification in the aorta and  coronary arteries. Mediastinum/Nodes: Esophagus is decompressed. Thyroid gland is unremarkable. Scattered mediastinal lymph nodes are not pathologically enlarged. Lungs/Pleura: Motion artifact limits evaluation. There a few scattered focal nodular areas of infiltration with slight peripheral infiltration in the lung bases. Findings likely to represent early multifocal pneumonia. Follow-up after resolution of acute process is recommended to exclude underlying pulmonary nodules. Upper Abdomen: Mild diffuse fatty infiltration of the liver. No acute abnormalities demonstrated in the visualized upper abdomen. Musculoskeletal: No chest wall abnormality. No acute or significant osseous findings. Review of the MIP images confirms the above findings. IMPRESSION: 1. No evidence of significant pulmonary embolus. 2. A few scattered focal nodular areas of infiltration with slight peripheral infiltration in the lung bases. Findings likely to represent early multifocal pneumonia. Non-contrast chest CT at 3-6 months is recommended. If nodules persist, subsequent management will  be based upon the most suspicious nodule(s). This recommendation follows the consensus statement: Guidelines for Management of Incidental Pulmonary Nodules Detected on CT Images: From the Fleischner Society 2017; Radiology 2017; 284:228-243. 3. Mild diffuse fatty infiltration of the liver. 4. Aortic atherosclerosis. Aortic Atherosclerosis (ICD10-I70.0). Electronically Signed   By: Burman Nieves M.D.   On: 03/11/2021 19:24   DG Chest Portable 1 View  Result Date: 03/11/2021 CLINICAL DATA:  Shortness of breath, body aches EXAM: PORTABLE CHEST 1 VIEW COMPARISON:  11/11/2015 FINDINGS: Heart size is within normal limits. Mildly increased interstitial markings within the lung bases, right greater than left. No appreciable pleural fluid collection. No pneumothorax. IMPRESSION: Mildly increased interstitial markings within the lung bases, right greater than  left, which could reflect mild interstitial edema versus atypical/viral infection. Electronically Signed   By: Duanne Guess D.O.   On: 03/11/2021 12:27    Microbiology: Recent Results (from the past 240 hour(s))  Resp Panel by RT-PCR (Flu A&B, Covid) Nasopharyngeal Swab     Status: Abnormal   Collection Time: 03/11/21 12:01 PM   Specimen: Nasopharyngeal Swab; Nasopharyngeal(NP) swabs in vial transport medium  Result Value Ref Range Status   SARS Coronavirus 2 by RT PCR POSITIVE (A) NEGATIVE Final    Comment: RESULT CALLED TO, READ BACK BY AND VERIFIED WITH: WEST,S. RN AT 1256 03/11/21 MULLINS,T (NOTE) SARS-CoV-2 target nucleic acids are DETECTED.  The SARS-CoV-2 RNA is generally detectable in upper respiratory specimens during the acute phase of infection. Positive results are indicative of the presence of the identified virus, but do not rule out bacterial infection or co-infection with other pathogens not detected by the test. Clinical correlation with patient history and other diagnostic information is necessary to determine patient infection status. The expected result is Negative.  Fact Sheet for Patients: BloggerCourse.com  Fact Sheet for Healthcare Providers: SeriousBroker.it  This test is not yet approved or cleared by the [place] FDA and  has been authorized for detection and/or diagnosis of SARS-CoV-2 by FDA under an Emergency Use Authorization (EUA).  This EUA will remain in effect (meaning this test ca n be used) for the duration of  the COVID-19 declaration under Section 564(b)(1) of the Act, 21 U.S.C. section 360bbb-3(b)(1), unless the authorization is terminated or revoked sooner.     Influenza A by PCR NEGATIVE NEGATIVE Final   Influenza B by PCR NEGATIVE NEGATIVE Final    Comment: (NOTE) The Xpert Xpress SARS-CoV-2/FLU/RSV plus assay is intended as an aid in the diagnosis of influenza from  Nasopharyngeal swab specimens and should not be used as a sole basis for treatment. Nasal washings and aspirates are unacceptable for Xpert Xpress SARS-CoV-2/FLU/RSV testing.  Fact Sheet for Patients: BloggerCourse.com  Fact Sheet for Healthcare Providers: SeriousBroker.it  This test is not yet approved or cleared by the [place] FDA and has been authorized for detection and/or diagnosis of SARS-CoV-2 by FDA under an Emergency Use Authorization (EUA). This EUA will remain in effect (meaning this test can be used) for the duration of the COVID-19 declaration under Section 564(b)(1) of the Act, 21 U.S.C. section 360bbb-3(b)(1), unless the authorization is terminated or revoked.  Performed at [facility], [address] [place], [place] [address]   Group A Strep by PCR     Status: None   Collection Time: 03/11/21 12:04 PM   Specimen: Throat; Sterile Swab  Result Value Ref Range Status   Group A Strep by PCR NOT DETECTED NOT DETECTED Final  Comment: Performed at [facility], [address] [place], [place] [address]  Blood culture (routine single)     Status: None (Preliminary result)   Collection Time: 03/11/21 12:04 PM   Specimen: Site Not Specified; Blood  Result Value Ref Range Status   Specimen Description   Final    SITE NOT SPECIFIED Performed at [facility], [address] [place], [place] [address]    Special Requests   Final    BOTTLES DRAWN AEROBIC AND ANAEROBIC Blood Culture adequate volume Performed at [facility], [address], [place], [place] [address]    Culture   Final    NO GROWTH 4 DAYS Performed at [facility], [address], [place], [place] [address]    Report Status PENDING  Incomplete     Labs: Basic Metabolic Panel: Recent Labs  Lab 03/11/21 1204 03/11/21 1845 03/12/21 0351 03/13/21 0320  03/14/21 0410  NA 138  --  136 136 140  K 4.1  --  4.0 4.0 4.3  CL 103  --  100 99 101  CO2 25  --  25 28 30   GLUCOSE 110*  --  183* 172* 166*  BUN 12  --  13 23 27*  CREATININE 0.66 0.56 0.62 0.81 0.78  CALCIUM 9.1  --  8.9 9.1 9.1  MG  --   --  2.1 2.2 2.2  PHOS  --   --  3.5 3.7 3.6   Liver Function Tests: Recent Labs  Lab 03/11/21 1204 03/12/21 0351 03/13/21 0320 03/14/21 0410  AST 52* 37 34 25  ALT 58* 48* 46* 35  ALKPHOS 102 87 80 73  BILITOT 0.8 0.6 0.5 0.4  PROT 8.1 7.9 7.9 7.5  ALBUMIN 4.3 4.2 4.1 3.8   Recent Labs  Lab 03/11/21 1204  LIPASE 36   CBC: Recent Labs  Lab 03/11/21 1204 03/11/21 1845 03/12/21 0351 03/13/21 0320 03/14/21 0410  WBC 6.3 6.7 6.0 11.3* 7.0  NEUTROABS 4.8  --  5.0 9.5* 5.3  HGB 13.8 12.7 12.9 12.7 12.7  HCT 43.6 39.2 40.1 39.3 39.5  MCV 95.2 94.7 95.0 93.6 95.2  PLT 139* 145* 152 177 172    Principal Problem:   Acute respiratory failure with hypoxemia (HCC) Active Problems:   COPD (chronic obstructive pulmonary disease) (HCC)   Pneumonia due to COVID-19 virus   Time coordinating discharge: 25 minutes  Signed:  05/14/21, MD  [facility]  03/15/2021, 4:35 PM

## 2021-03-15 NOTE — TOC Progression Note (Addendum)
Transition of Care [facility]) - Progression Note    Patient Details  Name: Nancy Farmer MRN: [id number] Date of Birth: July 14, 1956  [facility]) CM/SW Contact  Geni Bers, RN Phone Number: 03/15/2021, 11:53 AM  Clinical Narrative:    Spoke with pt concerning home O2, [employer] was selected for home O2. Referral given to in house rep.        Expected Discharge Plan and Services                                                 Social Determinants of Health (SDOH) Interventions    Readmission Risk Interventions No flowsheet data found.

## 2021-03-15 NOTE — Plan of Care (Signed)
  Problem: Health Behavior/Discharge Planning: Goal: Ability to manage health-related needs will improve Outcome: Adequate for Discharge   Problem: Clinical Measurements: Goal: Ability to maintain clinical measurements within normal limits will improve Outcome: Adequate for Discharge Goal: Will remain free from infection Outcome: Adequate for Discharge Goal: Diagnostic test results will improve Outcome: Adequate for Discharge Goal: Respiratory complications will improve Outcome: Adequate for Discharge Goal: Cardiovascular complication will be avoided Outcome: Adequate for Discharge   Problem: Activity: Goal: Risk for activity intolerance will decrease Outcome: Adequate for Discharge   Problem: Coping: Goal: Level of anxiety will decrease Outcome: Adequate for Discharge   Problem: Elimination: Goal: Will not experience complications related to bowel motility Outcome: Adequate for Discharge Goal: Will not experience complications related to urinary retention Outcome: Adequate for Discharge   Problem: Pain Managment: Goal: General experience of comfort will improve Outcome: Adequate for Discharge   Problem: Safety: Goal: Ability to remain free from injury will improve Outcome: Adequate for Discharge   Problem: Skin Integrity: Goal: Risk for impaired skin integrity will decrease Outcome: Adequate for Discharge   Problem: Coping: Goal: Psychosocial and spiritual needs will be supported Outcome: Adequate for Discharge   Problem: Respiratory: Goal: Will maintain a patent airway Outcome: Adequate for Discharge Goal: Complications related to the disease process, condition or treatment will be avoided or minimized Outcome: Adequate for Discharge

## 2021-03-16 LAB — CULTURE, BLOOD (SINGLE)
Culture: NO GROWTH
Special Requests: ADEQUATE

## 2021-03-31 ENCOUNTER — Institutional Professional Consult (permissible substitution): Payer: Managed Care, Other (non HMO) | Admitting: Pulmonary Disease

## 2021-04-01 DIAGNOSIS — F430 Acute stress reaction: Secondary | ICD-10-CM | POA: Insufficient documentation

## 2021-04-01 DIAGNOSIS — M5136 Other intervertebral disc degeneration, lumbar region: Secondary | ICD-10-CM | POA: Insufficient documentation

## 2021-04-01 DIAGNOSIS — E559 Vitamin D deficiency, unspecified: Secondary | ICD-10-CM | POA: Insufficient documentation

## 2021-04-01 DIAGNOSIS — F4320 Adjustment disorder, unspecified: Secondary | ICD-10-CM | POA: Insufficient documentation

## 2021-04-01 DIAGNOSIS — E782 Mixed hyperlipidemia: Secondary | ICD-10-CM | POA: Insufficient documentation

## 2021-04-02 ENCOUNTER — Encounter: Payer: Self-pay | Admitting: Pulmonary Disease

## 2021-04-02 ENCOUNTER — Ambulatory Visit (INDEPENDENT_AMBULATORY_CARE_PROVIDER_SITE_OTHER): Payer: Commercial Managed Care - PPO | Admitting: Pulmonary Disease

## 2021-04-02 ENCOUNTER — Other Ambulatory Visit: Payer: Self-pay

## 2021-04-02 VITALS — BP 112/60 | HR 88 | Ht 66.0 in | Wt 203.0 lb

## 2021-04-02 DIAGNOSIS — R0602 Shortness of breath: Secondary | ICD-10-CM | POA: Diagnosis not present

## 2021-04-02 DIAGNOSIS — U071 COVID-19: Secondary | ICD-10-CM

## 2021-04-02 DIAGNOSIS — J9601 Acute respiratory failure with hypoxia: Secondary | ICD-10-CM | POA: Diagnosis not present

## 2021-04-02 DIAGNOSIS — R5383 Other fatigue: Secondary | ICD-10-CM | POA: Diagnosis not present

## 2021-04-02 MED ORDER — TRELEGY ELLIPTA 100-62.5-25 MCG/INH IN AEPB: 1.0000 | INHALATION_SPRAY | Freq: Every day | RESPIRATORY_TRACT | 0 refills | Status: AC

## 2021-04-02 NOTE — Patient Instructions (Addendum)
Acute hypoxemic respiratory failure - resolved --No further oxygen needs  COVID-19 pneumonia CT Chest with nodular opacities --Repeat CT chest without contrast in August 2022  Presumed COPD/chronic bronchitis Tobacco Abuse --Arrange for pulmonary function tests prior to next visit --START Trelegy ONE puff ONCE a day. Sample provided  Fatigue --Likely secondary to COVID-19 pneumonia --Recommend regular activity including aerobic activity daily  Follow-up with me after PFTs when next available

## 2021-04-02 NOTE — Progress Notes (Signed)
Subjective:   PATIENT ID: Nancy Farmer GENDER: female DOB: 09-Mar-1956, MRN: [id number]   HPI  Chief Complaint  Patient presents with  . Consult    COPD, post covid   Reason for Visit: New consult for COVID pneumonia hospitalization  [name] is 65 year old female with COPD with recent COVID-19 pneumonia who presents for new consult.  Discharge note by Dr. Irene Limbo for hospitalization from 03/11/21-03/15/21 reviewed. She was admitted for COVID-19 pneumonia treated with remdesivir and steroids and discharged on 1L oxygen. Prior to this, she did not require oxygen at home. She was seen by her PCP yesterday and started on Trelegy. She has not yet started the inhaler.  She has had baseline shortness of breath for years. Denies wheezing or chronic cough. She was told she had COPD in 2015. She was previously on Symbicort and albuterol in the past. She has mainly used the rescue inhaler in recent years. She would use albuterol 2-3 times a week. She usually has annual bronchitis that would response levofloxacin with last episode in 2018.  Since discharge, she has improved however continues to have fatigue and has not been able to fully return to work. She has been using Combivent twice a day for chest heaviness/tightness and shortness of breath and believes that it may be helping however she has a cough with use. Her taste and smell have not returned. She has upper airway sensation of something stuck.  Last week she has ~6 days of blood tinged sputum that became stringy near the end. This has self resolved though she still has yellow-colored sputum.  Social History: Former smoker. Quit in 2015. 44 pack-years Works as Loss adjuster, [employer]  I have personally reviewed patient's past medical/family/social history/allergies/current medications.  Past Medical History:  Diagnosis Date  . Bronchitis   . COPD (chronic obstructive pulmonary disease) (HCC)   . High cholesterol       History reviewed. No pertinent family history.   Social History   Occupational History  . Not on file  Tobacco Use  . Smoking status: Former Smoker    Types: Cigarettes  . Smokeless tobacco: Never Used  Substance and Sexual Activity  . Alcohol use: No    Alcohol/week: 0.0 standard drinks  . Drug use: No  . Sexual activity: Never    Allergies  Allergen Reactions  . Corticosteroids     Other reaction(s): rash/HA     Outpatient Medications Prior to Visit  Medication Sig Dispense Refill  . ascorbic acid (VITAMIN C) 500 MG tablet Take 1 tablet (500 mg total) by mouth daily.    Marland Kitchen aspirin-sod bicarb-citric acid (ALKA-SELTZER) 325 MG TBEF tablet Take 325 mg by mouth every 6 (six) hours as needed (congestion).    Marland Kitchen dextromethorphan-guaiFENesin (MUCINEX DM) 30-600 MG 12hr tablet Take 1 tablet by mouth 2 (two) times daily as needed for cough.    Marland Kitchen ibuprofen (ADVIL) 200 MG tablet Take 600 mg by mouth every 6 (six) hours as needed for fever, headache or mild pain.    Marland Kitchen zinc sulfate 220 (50 Zn) MG capsule Take 1 capsule (220 mg total) by mouth daily.     No facility-administered medications prior to visit.    Review of Systems  Constitutional: Positive for malaise/fatigue. Negative for chills, diaphoresis, fever and weight loss.  HENT: Negative for congestion, ear pain and sore throat.   Respiratory: Positive for shortness of breath. Negative for cough, hemoptysis, sputum production and wheezing.   Cardiovascular: Negative  for chest pain, palpitations and leg swelling.  Gastrointestinal: Negative for abdominal pain, heartburn and nausea.  Genitourinary: Negative for frequency.  Musculoskeletal: Negative for joint pain and myalgias.  Skin: Negative for itching and rash.  Neurological: Negative for dizziness, weakness and headaches.  Endo/Heme/Allergies: Does not bruise/bleed easily.  Psychiatric/Behavioral: Negative for depression. The patient is not nervous/anxious.       Objective:   Vitals:   04/02/21 1356  BP: 112/60  Pulse: 88  SpO2: 95%  Weight: 203 lb (92.1 kg)  Height: 5\' 6"  (1.676 m)   SpO2: 95 %  Physical Exam: General: Well-appearing, no acute distress HENT: [place], AT Eyes: EOMI, no scleral icterus Respiratory: Clear to auscultation bilaterally.  No crackles, wheezing or rales Cardiovascular: RRR, -M/R/G, no JVD Extremities:-Edema,-tenderness Neuro: AAO x4, CNII-XII grossly intact Skin: Intact, no rashes or bruising Psych: Normal mood, normal affect  Data Reviewed:  Imaging: CTA 03/11/21 - no pulmonary emboli. Peripheral focal nodular opacities scattered bilaterally  PFT: None on file  Labs: CBC    Component Value Date/Time   WBC 7.0 03/14/2021 0410   RBC 4.15 03/14/2021 0410   HGB 12.7 03/14/2021 0410   HCT 39.5 03/14/2021 0410   PLT 172 03/14/2021 0410   MCV 95.2 03/14/2021 0410   MCH 30.6 03/14/2021 0410   MCHC 32.2 03/14/2021 0410   RDW 13.5 03/14/2021 0410   LYMPHSABS 1.3 03/14/2021 0410   MONOABS 0.4 03/14/2021 0410   EOSABS 0.0 03/14/2021 0410   BASOSABS 0.0 03/14/2021 0410   Imaging, labs and test noted above have been reviewed independently by me.    Assessment & Plan:   Discussion: 65 year old female with COPD and acute hypoxemic respiratory failure secondary to COVID-19 pneumonia who presents as a new consult  Acute hypoxemic respiratory failure - resolved --No further oxygen needs  COVID-19 pneumonia CT Chest with nodular opacities --Repeat CT chest without contrast in August 2022  Presumed COPD/chronic bronchitis Tobacco Abuse --Arrange for pulmonary function tests prior to next visit --START Trelegy ONE puff ONCE a day. Sample provided  Fatigue --Likely secondary to COVID-19 pneumonia --Recommend regular activity including aerobic activity daily  Health Maintenance Immunization History  Administered Date(s) Administered  . Tdap 08/10/2011   CT Lung Screen - not indicated  Orders  Placed This Encounter  Procedures  . CT Chest Wo Contrast    Standing Status:   Future    Standing Expiration Date:   04/02/2022    Order Specific Question:   Preferred imaging location?    Answer:   [place] [facility]  . Pulmonary Function Test    Standing Status:   Future    Standing Expiration Date:   04/02/2022    Scheduling Instructions:     1 hour with provider follow up next available    Order Specific Question:   Where should this test be performed?    Answer:   [facility]    Order Specific Question:   Full PFT: includes the following: basic spirometry, spirometry pre & post bronchodilator, diffusion capacity (DLCO), lung volumes    Answer:   Full PFT   Meds ordered this encounter  Medications  . Fluticasone-Umeclidin-Vilant (TRELEGY ELLIPTA) 100-62.5-25 MCG/INH AEPB    Sig: Inhale 1 puff into the lungs daily.    Dispense:  1 each    Refill:  0    Return in about 1 month (around 05/03/2021).  I have spent a total time of 45-minutes on the day of the  appointment reviewing prior documentation, coordinating care and discussing medical diagnosis and plan with the patient/family. Imaging, labs and tests included in this note have been reviewed and interpreted independently by me.  Rickita Forstner Mechele Collin, MD [facility] 04/02/2021 1:58 PM  Office Number [phone]

## 2021-04-05 ENCOUNTER — Ambulatory Visit (INDEPENDENT_AMBULATORY_CARE_PROVIDER_SITE_OTHER): Payer: Commercial Managed Care - PPO | Admitting: Nurse Practitioner

## 2021-04-05 VITALS — BP 122/76 | HR 77 | Temp 97.9°F | Resp 18

## 2021-04-05 DIAGNOSIS — R5381 Other malaise: Secondary | ICD-10-CM

## 2021-04-05 DIAGNOSIS — Z8616 Personal history of COVID-19: Secondary | ICD-10-CM | POA: Diagnosis not present

## 2021-04-05 NOTE — Progress Notes (Signed)
@Patient  ID: , female    DOB: Nov 18, 1955, 65 y.o.   MRN: [id number]  Chief Complaint  Patient presents with  . history of covid    Referring provider: [id number], MD  HPI  Patient presents today for post-COVID care clinic visit.  Patient was hospitalized at the end of April for COVID-pneumonia.  She was discharged home on oxygen.  She has been followed by pulmonary.  She has been started on Trelegy and does have a follow-up CT scan and PFTs ordered.  She was told that she could discontinue her oxygen.  O2 sats are at 100% on room air in office today.  Patient states that she is still struggling with extreme fatigue.  She also states that her taste and smell have not returned.  We discussed that she can trial essentials for olfactory retraining.  We will order physical therapy to help with fatigue as well as cardiac and pulmonary rehab. Denies f/c/s, n/v/d, hemoptysis, PND, chest pain or edema.       Allergies  Allergen Reactions  . Corticosteroids     Other reaction(s): rash/HA    Immunization History  Administered Date(s) Administered  . Tdap 08/10/2011    Past Medical History:  Diagnosis Date  . Bronchitis   . COPD (chronic obstructive pulmonary disease) (HCC)   . High cholesterol     Tobacco History: Social History   Tobacco Use  Smoking Status Former Smoker  . Packs/day: 1.00  . Years: 44.00  . Pack years: 44.00  . Types: Cigarettes  . Start date: 11/14/1972  . Quit date: 11/14/2013  . Years since quitting: 7.3  Smokeless Tobacco Never Used   Counseling given: Yes   Outpatient Encounter Medications as of 04/05/2021  Medication Sig  . ascorbic acid (VITAMIN C) 500 MG tablet Take 1 tablet (500 mg total) by mouth daily.  04/07/2021 aspirin-sod bicarb-citric acid (ALKA-SELTZER) 325 MG TBEF tablet Take 325 mg by mouth every 6 (six) hours as needed (congestion).  Marland Kitchen dextromethorphan-guaiFENesin (MUCINEX DM) 30-600 MG 12hr tablet Take 1 tablet by mouth 2  (two) times daily as needed for cough.  . Fluticasone-Umeclidin-Vilant (TRELEGY ELLIPTA) 100-62.5-25 MCG/INH AEPB Inhale 1 puff into the lungs daily.  . Fluticasone-Umeclidin-Vilant (TRELEGY ELLIPTA) 200-62.5-25 MCG/INH AEPB 1 puff  . Ipratropium-Albuterol (COMBIVENT IN) Inhale into the lungs.  . zinc sulfate 220 (50 Zn) MG capsule Take 1 capsule (220 mg total) by mouth daily.   No facility-administered encounter medications on file as of 04/05/2021.     Review of Systems  Review of Systems  Constitutional: Positive for fatigue. Negative for fever.  HENT: Negative.   Respiratory: Positive for shortness of breath.   Cardiovascular: Negative.  Negative for chest pain, palpitations and leg swelling.  Gastrointestinal: Negative.   Allergic/Immunologic: Negative.   Neurological: Negative.        Loss of taste and smell  Psychiatric/Behavioral: Negative.        Physical Exam  BP 122/76   Pulse 77   Temp 97.9 F (36.6 C)   Resp 18   SpO2 99%   Wt Readings from Last 5 Encounters:  04/02/21 203 lb (92.1 kg)  03/11/21 212 lb (96.2 kg)  10/10/16 198 lb 6.4 oz (90 kg)  11/11/15 196 lb (88.9 kg)  08/28/14 172 lb (78 kg)     Physical Exam Vitals and nursing note reviewed.  Constitutional:      General: She is not in acute distress.    Appearance: She is well-developed.  Cardiovascular:     Rate and Rhythm: Normal rate and regular rhythm.  Pulmonary:     Effort: Pulmonary effort is normal.     Breath sounds: Normal breath sounds.  Musculoskeletal:     Right lower leg: No edema.     Left lower leg: No edema.  Neurological:     Mental Status: She is alert and oriented to person, place, and time.  Psychiatric:        Mood and Affect: Mood normal.        Behavior: Behavior normal.     Imaging: CT ANGIO CHEST PE W OR WO CONTRAST  Result Date: 03/11/2021 CLINICAL DATA:  Pulmonary embolus suspected with low a 2 in a medial probability. Positive D-dimer. Cough and sore  throat, beginning about 4 days ago. History of COPD. EXAM: CT ANGIOGRAPHY CHEST WITH CONTRAST TECHNIQUE: Multidetector CT imaging of the chest was performed using the standard protocol during bolus administration of intravenous contrast. Multiplanar CT image reconstructions and MIPs were obtained to evaluate the vascular anatomy. CONTRAST:  65mL OMNIPAQUE IOHEXOL 350 MG/ML SOLN COMPARISON:  Chest radiograph 03/11/2021 FINDINGS: Cardiovascular: Good opacification of the central and segmental pulmonary arteries. No focal filling defects. No evidence of significant pulmonary embolus. Normal heart size. No pericardial effusions. Normal caliber thoracic aorta. No aortic dissection. Mild calcification in the aorta and coronary arteries. Mediastinum/Nodes: Esophagus is decompressed. Thyroid gland is unremarkable. Scattered mediastinal lymph nodes are not pathologically enlarged. Lungs/Pleura: Motion artifact limits evaluation. There a few scattered focal nodular areas of infiltration with slight peripheral infiltration in the lung bases. Findings likely to represent early multifocal pneumonia. Follow-up after resolution of acute process is recommended to exclude underlying pulmonary nodules. Upper Abdomen: Mild diffuse fatty infiltration of the liver. No acute abnormalities demonstrated in the visualized upper abdomen. Musculoskeletal: No chest wall abnormality. No acute or significant osseous findings. Review of the MIP images confirms the above findings. IMPRESSION: 1. No evidence of significant pulmonary embolus. 2. A few scattered focal nodular areas of infiltration with slight peripheral infiltration in the lung bases. Findings likely to represent early multifocal pneumonia. Non-contrast chest CT at 3-6 months is recommended. If nodules persist, subsequent management will be based upon the most suspicious nodule(s). This recommendation follows the consensus statement: Guidelines for Management of Incidental Pulmonary  Nodules Detected on CT Images: From the Fleischner Society 2017; Radiology 2017; 284:228-243. 3. Mild diffuse fatty infiltration of the liver. 4. Aortic atherosclerosis. Aortic Atherosclerosis (ICD10-I70.0). Electronically Signed   By: Burman Nieves M.D.   On: 03/11/2021 19:24   DG Chest Portable 1 View  Result Date: 03/11/2021 CLINICAL DATA:  Shortness of breath, body aches EXAM: PORTABLE CHEST 1 VIEW COMPARISON:  11/11/2015 FINDINGS: Heart size is within normal limits. Mildly increased interstitial markings within the lung bases, right greater than left. No appreciable pleural fluid collection. No pneumothorax. IMPRESSION: Mildly increased interstitial markings within the lung bases, right greater than left, which could reflect mild interstitial edema versus atypical/viral infection. Electronically Signed   By: Duanne Guess D.O.   On: 03/11/2021 12:27     Assessment & Plan:   History of COVID-19 History of Covid pneumonia and respiratory failure Fatigue Body aches Shortness of breath:   Stay well hydrated  Stay active  Deep breathing exercises  May start vitamin C daily, vitamin D3 daily, Zinc daily  May take tylenol or fever or pain  Continue current inhalers  Will order PT  Continue to follow with pulmonary   Diarrhea:  May start Probiotic   Olfactory re-training:  May try essential oils through on-line retraining   Follow up:  Follow up in 3 months or sooner if needed      Ivonne Andrew, NP 04/05/2021

## 2021-04-05 NOTE — Assessment & Plan Note (Signed)
History of Covid pneumonia and respiratory failure Fatigue Body aches Shortness of breath:   Stay well hydrated  Stay active  Deep breathing exercises  May start vitamin C daily, vitamin D3 daily, Zinc daily  May take tylenol or fever or pain  Continue current inhalers  Will order PT  Continue to follow with pulmonary   Diarrhea:  May start Probiotic   Olfactory re-training:  May try essential oils through on-line retraining   Follow up:  Follow up in 3 months or sooner if needed  

## 2021-04-05 NOTE — Patient Instructions (Addendum)
History of Covid pneumonia and respiratory failure Fatigue Body aches Shortness of breath:   Stay well hydrated  Stay active  Deep breathing exercises  May start vitamin C daily, vitamin D3 daily, Zinc daily  May take tylenol or fever or pain  Continue current inhalers  Will order PT  Continue to follow with pulmonary   Diarrhea:  May start Probiotic   Olfactory re-training:  May try essential oils through on-line retraining   Follow up:  Follow up in 3 months or sooner if needed

## 2021-04-13 ENCOUNTER — Encounter: Payer: Self-pay | Admitting: Pulmonary Disease

## 2021-04-21 ENCOUNTER — Ambulatory Visit: Payer: Commercial Managed Care - PPO | Attending: Nurse Practitioner | Admitting: Physical Therapy

## 2021-04-21 ENCOUNTER — Other Ambulatory Visit: Payer: Self-pay

## 2021-04-21 ENCOUNTER — Encounter: Payer: Self-pay | Admitting: Physical Therapy

## 2021-04-21 DIAGNOSIS — R2689 Other abnormalities of gait and mobility: Secondary | ICD-10-CM | POA: Insufficient documentation

## 2021-04-21 DIAGNOSIS — M6281 Muscle weakness (generalized): Secondary | ICD-10-CM | POA: Insufficient documentation

## 2021-04-21 NOTE — Therapy (Signed)
[facility] [address] [place], [place], [address] Phone: [phone]   Fax:  [phone]  Physical Therapy Evaluation  Patient Details  Name: Nancy Farmer MRN: [id number] Date of Birth: [birth year] Referring Provider (PT): Ivonne Andrew, NP   Encounter Date: 04/21/2021  PT End of Session - 04/21/21 1623    Visit Number 1    Number of Visits 8    Date for PT Re-Evaluation 06/16/21    PT Start Time 1530    PT Stop Time 1615    PT Time Calculation (min) 45 min             Past Medical History:  Diagnosis Date  . Bronchitis   . COPD (chronic obstructive pulmonary disease) (HCC)   . High cholesterol     Past Surgical History:  Procedure Laterality Date  . LAZER CONIZATION    . TONSILLECTOMY      Vitals:   04/21/21 1544  SpO2: 96%      Subjective Assessment - 04/21/21 1615    Subjective Nancy Farmer is a 65 y.o. female who presents to clinic with chief complaint of deconditioning and weakness d/t history of COVID and COPD.  MOI/History of condition: chronic history of COPD.  She got COVID and pneumonia 03/12/2021.  Since this time she feels like she is "unable to do what my mind wants to do".  Denies pain.    Pertinent History COPD    Limitations Standing;Walking    Currently in Pain? No/denies    Multiple Pain Sites No              [facility] PT Assessment - 04/21/21 0001      Assessment   Medical Diagnosis History of COVID-19 (Z86.16), Physical deconditioning (R53.81)    Referring Provider (PT) Ivonne Andrew, NP    Onset Date/Surgical Date 03/12/21    Hand Dominance Right    Next MD Visit august    Prior Therapy none      Precautions   Precaution Comments COPD      Restrictions   Other Position/Activity Restrictions none      Balance Screen   Has the patient fallen in the past 6 months No      Home Environment   Living Environment Private residence    Living Arrangements Children     Available Help at Discharge Family    Type of Home House    Home Access Stairs to enter    Entrance Stairs-Number of Steps 2    Entrance Stairs-Rails Right    Home Layout One level    Home Equipment None      Prior Function   Level of Independence Independent    Vocation Requirements sitting job    Leisure likes to swim, working in yard      Sensation   Light Touch Appears Intact      Functional Tests   Functional tests Other      Other:   Other/ Comments 6 min walk test: 1960'      ROM / Strength   AROM / PROM / Strength Strength      Strength   Strength Assessment Site Knee;Ankle;Hip    Right/Left Hip Right;Left    Right Hip Flexion 5/5    Right Hip Extension 3+/5    Right Hip ABduction 4/5    Left Hip Flexion 5/5    Left Hip Extension 3+/5    Left Hip ABduction 3+/5  Right/Left Knee Right;Left    Right Knee Flexion 3+/5    Right Knee Extension 4/5    Left Knee Flexion 3+/5    Left Knee Extension 4/5    Right/Left Ankle Right;Left      Ambulation/Gait   Ambulation Distance (Feet) 30 Feet    Gait velocity .94 m/s      High Level Balance   High Level Balance Comments R: 6'' L 5'' SLS                      Objective measurements completed on examination: See above findings.               PT Education - 04/21/21 1618    Education Details POC, diagnosis, prognosis, HEP    Person(s) Educated Patient    Methods Explanation    Comprehension Verbalized understanding            PT Short Term Goals - 04/21/21 1630      PT SHORT TERM GOAL #1   Title Nancy Farmer will be >75% HEP compliant within 3 weeks to improve carryover between sessions and facilitate independent management of condition.    Target Date 06/16/21             PT Long Term Goals - 04/21/21 1631      PT LONG TERM GOAL #1   Title Nancy Farmer will be able to stand for >30''' in SLS stance, to show a significant improvement in balance in order to  reduce fall risk    Baseline see flowsheet    Target Date 06/16/21      PT LONG TERM GOAL #2   Title Nancy Farmer will improve 30'' STS (MCID 2) to >/= 12x to show improved LE strength and improved transfers.    Baseline 9x    Target Date 06/16/21      PT LONG TERM GOAL #3   Title Nancy Farmer will improve the following MMTs to >/= 4/5 to show improvement in strength:  Knee, hip abduction, and hip ext    Baseline see flowsheet    Target Date 06/16/21      PT LONG TERM GOAL #4   Title Nancy Farmer will improve gait speed to 1.2 m/s (.1 m/s MCID) to show functional improvement in ambulation    Baseline .94 m/s    Target Date 06/16/21                  Plan - 04/21/21 1624    Clinical Impression Statement Nancy Farmer is a 65 y.o. female who presents to clinic with signs and sxs consistent with muscle weakness and deconditioning following chronic COPD with exacerbation d/t COVID-19 and pneumonia.  Pt has above average 6 min walk test for age.  Pt presents with notable deficits in: strength and balance.  Pt is limited functionally in yardwork and longer walks.  Pt will benefit from skilled therapy to address pain and the listed deficits in order to achieve functional goals, enable safety and independence in completion of daily tasks, and return to PLOF.  Pt to see what exact co-pay will be before continuing with PT (may or not be feasible).    Personal Factors and Comorbidities Comorbidity 1    Comorbidities COPD    Stability/Clinical Decision Making Stable/Uncomplicated    Clinical Decision Making Low    Rehab Potential Good    PT Frequency 1x / week    PT Duration 8 weeks  PT Treatment/Interventions ADLs/Self Care Home Management;Aquatic Therapy;Gait training;Therapeutic activities;Therapeutic exercise;Neuromuscular re-education;Manual techniques;Dry needling;Spinal Manipulations    PT Next Visit Plan improve general LE weakness, issue HEP    Recommended  Other Services none    Consulted and Agree with Plan of Care Patient           Patient will benefit from skilled therapeutic intervention in order to improve the following deficits and impairments:  Decreased activity tolerance,Decreased endurance,Decreased strength,Decreased balance  Visit Diagnosis: Balance problem  Muscle weakness      Problem List Patient Active Problem List   Diagnosis Date Noted  . History of COVID-19 04/05/2021  . Physical deconditioning 04/05/2021  . Acute stress reaction 04/01/2021  . Adjustment disorder 04/01/2021  . Degeneration of lumbar intervertebral disc 04/01/2021  . Mixed hyperlipidemia 04/01/2021  . Morbid obesity (HCC) 04/01/2021  . Vitamin D deficiency 04/01/2021  . Pneumonia due to COVID-19 virus 03/11/2021  . COPD (chronic obstructive pulmonary disease) (HCC) 08/18/2014  . Tobacco abuse 08/18/2014  . Atypical chest pain 08/18/2014    Nancy Farmer PT, DPT 04/21/21 4:35 PM  [facility] [address] [place], [place], [address] Phone: [phone]   Fax:  [phone]  Name: Nancy Farmer MRN: [id number] Date of Birth: 10-28-1956

## 2021-05-07 ENCOUNTER — Other Ambulatory Visit (HOSPITAL_COMMUNITY): Payer: Managed Care, Other (non HMO)

## 2021-05-11 ENCOUNTER — Ambulatory Visit: Payer: Managed Care, Other (non HMO) | Admitting: Pulmonary Disease

## 2021-06-21 ENCOUNTER — Other Ambulatory Visit: Payer: Self-pay | Admitting: Family Medicine

## 2021-06-21 DIAGNOSIS — Z1231 Encounter for screening mammogram for malignant neoplasm of breast: Secondary | ICD-10-CM

## 2021-06-22 ENCOUNTER — Ambulatory Visit
Admission: RE | Admit: 2021-06-22 | Discharge: 2021-06-22 | Disposition: A | Discharge status: Home / Self Care | Payer: Commercial Managed Care - PPO | Source: Ambulatory Visit | Attending: Family Medicine | Admitting: Family Medicine

## 2021-06-22 ENCOUNTER — Other Ambulatory Visit: Payer: Self-pay

## 2021-06-22 DIAGNOSIS — Z1231 Encounter for screening mammogram for malignant neoplasm of breast: Secondary | ICD-10-CM

## 2021-07-02 ENCOUNTER — Other Ambulatory Visit: Payer: Self-pay

## 2021-07-03 LAB — SARS CORONAVIRUS 2 (TAT 6-24 HRS): SARS Coronavirus 2: NEGATIVE

## 2021-07-05 ENCOUNTER — Ambulatory Visit: Payer: Commercial Managed Care - PPO | Admitting: Acute Care

## 2021-07-05 ENCOUNTER — Other Ambulatory Visit: Payer: Self-pay

## 2021-07-05 ENCOUNTER — Ambulatory Visit (INDEPENDENT_AMBULATORY_CARE_PROVIDER_SITE_OTHER): Payer: Commercial Managed Care - PPO | Admitting: Pulmonary Disease

## 2021-07-05 DIAGNOSIS — U071 COVID-19: Secondary | ICD-10-CM

## 2021-07-05 DIAGNOSIS — J9601 Acute respiratory failure with hypoxia: Secondary | ICD-10-CM

## 2021-07-05 DIAGNOSIS — R0602 Shortness of breath: Secondary | ICD-10-CM | POA: Diagnosis not present

## 2021-07-05 DIAGNOSIS — R5383 Other fatigue: Secondary | ICD-10-CM

## 2021-07-05 NOTE — Progress Notes (Signed)
Full PFT completed today ? ?

## 2021-07-06 ENCOUNTER — Ambulatory Visit: Payer: Managed Care, Other (non HMO)

## 2021-07-07 ENCOUNTER — Other Ambulatory Visit: Payer: Self-pay

## 2021-07-07 ENCOUNTER — Ambulatory Visit (INDEPENDENT_AMBULATORY_CARE_PROVIDER_SITE_OTHER)
Admission: RE | Admit: 2021-07-07 | Discharge: 2021-07-07 | Disposition: A | Discharge status: Home / Self Care | Payer: Commercial Managed Care - PPO | Source: Ambulatory Visit | Attending: Pulmonary Disease | Admitting: Pulmonary Disease

## 2021-07-07 DIAGNOSIS — U071 COVID-19: Secondary | ICD-10-CM

## 2021-07-07 DIAGNOSIS — J9601 Acute respiratory failure with hypoxia: Secondary | ICD-10-CM | POA: Diagnosis not present

## 2021-07-07 DIAGNOSIS — R0602 Shortness of breath: Secondary | ICD-10-CM

## 2021-07-07 DIAGNOSIS — R5383 Other fatigue: Secondary | ICD-10-CM

## 2021-07-08 LAB — PULMONARY FUNCTION TEST
DL/VA % pred: 91 %
DL/VA: 3.73 ml/min/mmHg/L
DLCO cor % pred: 70 %
DLCO cor: 15.19 ml/min/mmHg
DLCO unc % pred: 70 %
DLCO unc: 15.19 ml/min/mmHg
FEF 25-75 Post: 1.32 L/sec
FEF 25-75 Pre: 0.88 L/sec
FEF2575-%Change-Post: 50 %
FEF2575-%Pred-Post: 58 %
FEF2575-%Pred-Pre: 38 %
FEV1-%Change-Post: 13 %
FEV1-%Pred-Post: 65 %
FEV1-%Pred-Pre: 57 %
FEV1-Post: 1.75 L
FEV1-Pre: 1.53 L
FEV1FVC-%Change-Post: 4 %
FEV1FVC-%Pred-Pre: 85 %
FEV6-%Change-Post: 12 %
FEV6-%Pred-Post: 75 %
FEV6-%Pred-Pre: 67 %
FEV6-Post: 2.52 L
FEV6-Pre: 2.24 L
FEV6FVC-%Change-Post: 0 %
FEV6FVC-%Pred-Post: 102 %
FEV6FVC-%Pred-Pre: 101 %
FVC-%Change-Post: 8 %
FVC-%Pred-Post: 73 %
FVC-%Pred-Pre: 67 %
FVC-Post: 2.54 L
FVC-Pre: 2.33 L
Post FEV1/FVC ratio: 69 %
Post FEV6/FVC ratio: 99 %
Pre FEV1/FVC ratio: 66 %
Pre FEV6/FVC Ratio: 98 %
RV % pred: 118 %
RV: 2.63 L
TLC % pred: 94 %
TLC: 5.12 L

## 2021-07-25 NOTE — Progress Notes (Signed)
Cardiology Office Note:    Date:  07/26/2021   ID:  Nancy Farmer, DOB 09/08/1956, MRN [id number]  PCP:  Gweneth Dimitri, MD  Cardiologist:  None  Electrophysiologist:  None   Referring MD: Gweneth Dimitri, MD   Chief Complaint  Patient presents with   Heart Murmur    History of Present Illness:    Nancy Farmer is a 65 y.o. female with a hx of COPD, hyperlipidemia, cervical cancer who is referred by Dr. Corliss Blacker for evaluation of heart murmur.  She was admitted to [facility] in May 2022 with acute hypoxic respiratory failure secondary to COVID-19 pneumonia complicated by underlying COPD.  She was treated with remdesivir and steroids.  She reports that it took about 2 months to recover from this, but she currently feels back to her baseline.  Does report has been having some cramping pain in her chest.  Does not happen regularly, about once every 2 months.  Occurs in lower chest and lasts for less than 1 minute.  Does occur with exertion but is not reliably brought on by exertion.  Reports she does not exercise.  Most exertion she does is walking at work.  Also reports she gets short of breath with exertion.  States that she has noticed walking her trash can to the streets she will get short of breath.  She denies any lightheadedness, syncope, or palpitations.  Reports occasional lower extremity edema.  She smoked 1 pack/day x 44 years but quit in 2015.  She is adopted and does not know her family history but reports her son was recently diagnosed with high cholesterol.    Past Medical History:  Diagnosis Date   Bronchitis    COPD (chronic obstructive pulmonary disease) (HCC)    High cholesterol     Past Surgical History:  Procedure Laterality Date   LAZER CONIZATION     TONSILLECTOMY      Current Medications: Current Meds  Medication Sig   ascorbic acid (VITAMIN C) 500 MG tablet Take 1 tablet (500 mg total) by mouth daily.   Fluticasone-Umeclidin-Vilant (TRELEGY ELLIPTA)  100-62.5-25 MCG/INH AEPB Inhale 1 puff into the lungs daily.   Ipratropium-Albuterol (COMBIVENT IN) Inhale into the lungs.   metoprolol tartrate (LOPRESSOR) 25 MG tablet Take 25 mg (1 tablet) TWO hours prior to CT scan   Vitamin D, Ergocalciferol, (DRISDOL) 1.25 MG (50000 UNIT) CAPS capsule Take 50,000 Units by mouth once a week.   zinc sulfate 220 (50 Zn) MG capsule Take 1 capsule (220 mg total) by mouth daily.   [DISCONTINUED] rosuvastatin (CRESTOR) 10 MG tablet Take 10 mg by mouth daily.     Allergies:   Corticosteroids   Social History   Socioeconomic History   Marital status: Divorced    Spouse name: Not on file   Number of children: Not on file   Years of education: Not on file   Highest education level: Not on file  Occupational History   Not on file  Tobacco Use   Smoking status: Former    Packs/day: 1.00    Years: 44.00    Pack years: 44.00    Types: Cigarettes    Start date: 11/14/1972    Quit date: 11/14/2013    Years since quitting: 7.7   Smokeless tobacco: Never  Substance and Sexual Activity   Alcohol use: No    Alcohol/week: 0.0 standard drinks   Drug use: No   Sexual activity: Never  Other Topics Concern   Not on file  Social History Narrative   Not on file   Social Determinants of Health   Financial Resource Strain: Not on file  Food Insecurity: Not on file  Transportation Needs: Not on file  Physical Activity: Not on file  Stress: Not on file  Social Connections: Not on file     Family History: Son had hyperlipidemia  ROS:   Please see the history of present illness.     All other systems reviewed and are negative.  EKGs/Labs/Other Studies Reviewed:    The following studies were reviewed today:   EKG:  EKG is  ordered today.  The ekg ordered today demonstrates normal sinus rhythm, rate 61, nonspecific T wave flattening, Q waves in V1/2  Recent Labs: 03/14/2021: ALT 35; BUN 27; Creatinine, Ser 0.78; Hemoglobin 12.7; Magnesium 2.2; Platelets  172; Potassium 4.3; Sodium 140  Recent Lipid Panel    Component Value Date/Time   CHOL 293 (H) 08/28/2014 1246   TRIG 81 03/11/2021 1433   HDL 47 08/28/2014 1246   CHOLHDL 6.2 08/28/2014 1246   VLDL 36 08/28/2014 1246   LDLCALC 210 (H) 08/28/2014 1246    Physical Exam:    VS:  BP 128/72 (BP Location: Left Arm, Patient Position: Sitting, Cuff Size: Normal)   Pulse 61   Ht 5' 6.5" (1.689 m)   Wt 201 lb 3.2 oz (91.3 kg)   SpO2 97%   BMI 31.99 kg/m     Wt Readings from Last 3 Encounters:  07/26/21 201 lb 3.2 oz (91.3 kg)  04/02/21 203 lb (92.1 kg)  03/11/21 212 lb (96.2 kg)     GEN:  Well nourished, well developed in no acute distress HEENT: Normal NECK: No JVD; No carotid bruits LYMPHATICS: No lymphadenopathy CARDIAC: RRR, 2/6 systolic murmur RESPIRATORY:  Clear to auscultation without rales, wheezing or rhonchi  ABDOMEN: Soft, non-tender, non-distended MUSCULOSKELETAL:  No edema; No deformity  SKIN: Warm and dry NEUROLOGIC:  Alert and oriented x 3 PSYCHIATRIC:  Normal affect   ASSESSMENT:    1. Chest pain of uncertain etiology   2. Heart murmur   3. Pre-procedure lab exam   4. Hyperlipidemia, unspecified hyperlipidemia type    PLAN:    Chest pain: Atypical in description but does have CAD risk factors (smoking history, marked hyperlipidemia).  Recent CT chest with LAD calcifications.  Will check coronary CTA.  Will give metoprolol 25 mg prior to study.  Heart murmur: 2/6 systolic ejection murmur.  Also with aortic valve calcifications on recent CT chest.  Suspect aortic stenosis.  Will check echocardiogram  Hyperlipidemia: LDL 275 on 05/11/2021.  Prescribed rosuvastatin 10 mg daily but reports she did not start taking.  Suspect FH.  She is adopted and does not know family history but her son was recently diagnosed with hyperlipidemia in his 27s -Start rosuvastatin 20 mg daily.  Check fasting lipid panel in 3 months.  If unable to get LDL to goal, will plan referral  to lipid clinic for PCSK9 inhibitor  RTC in 6 months    Medication Adjustments/Labs and Tests Ordered: Current medicines are reviewed at length with the patient today.  Concerns regarding medicines are outlined above.  Orders Placed This Encounter  Procedures   CT CORONARY MORPH W/CTA COR W/SCORE W/CA W/CM &/OR WO/CM   Basic metabolic panel   EKG 12-Lead   ECHOCARDIOGRAM COMPLETE    Meds ordered this encounter  Medications   metoprolol tartrate (LOPRESSOR) 25 MG tablet    Sig: Take 25 mg (1  tablet) TWO hours prior to CT scan    Dispense:  1 tablet    Refill:  0   rosuvastatin (CRESTOR) 20 MG tablet    Sig: Take 1 tablet (20 mg total) by mouth daily.    Dispense:  90 tablet    Refill:  3    Dose increase-d/c 10 mg     Patient Instructions  Medication Instructions:  START rosuvastatin (Crestor) 20 mg daily  *If you need a refill on your cardiac medications before your next appointment, please call your pharmacy*   Lab Work: BMET today AND Please return for FASTING labs in 2-3 months (Lipid)  Our in office lab hours are Monday-Friday 8:00-4:00, closed for lunch 12:45-1:45 pm.  No appointment needed.  If you have labs (blood work) drawn today and your tests are completely normal, you will receive your results only by: MyChart Message (if you have MyChart) OR A paper copy in the mail If you have any lab test that is abnormal or we need to change your treatment, we will call you to review the results.  Testing/Procedures: Your physician has requested that you have an echocardiogram. Echocardiography is a painless test that uses sound waves to create images of your heart. It provides your doctor with information about the size and shape of your heart and how well your heart's chambers and valves are working. This procedure takes approximately one hour. There are no restrictions for this procedure. This [facility] be done [facility] location:  [employer]  Coronary CTA-see instructions below  Follow-Up: At [facility], you and your health needs are our priority.  As part of our continuing mission to provide you with exceptional heart care, we have created designated Provider Care Teams.  These Care Teams include your primary Cardiologist (physician) and Advanced Practice Providers (APPs -  Physician Assistants and Nurse Practitioners) who all work together to provide you with the care you need, when you need it.  We recommend signing up for the patient portal called "MyChart".  Sign up information is provided on this After Visit Summary.  MyChart is used to connect with patients for Virtual Visits (Telemedicine).  Patients are able to view lab/test results, encounter notes, upcoming appointments, etc.  Non-urgent messages can be sent to your provider as well.   To learn more about what you can do with MyChart, go to [email or web address].    Your next appointment:   6 month(s)  The format for your next appointment:   In Person  Provider:   Epifanio Lescheshristopher Griffon Herberg, MD   Other Instructions   Your cardiac CT will be scheduled at one of the below locations:   [facility] [address] [place], [place] [address] [phone]  If scheduled at [facility], please arrive at the [facility] main entrance (entrance A) of [facility] 30 minutes prior to test start time. Proceed to the [facility] Radiology Department (first floor) to check-in and test prep.   Please follow these instructions carefully (unless otherwise directed):   On the Night Before the Test: Be sure to Drink plenty of water. Do not consume any caffeinated/decaffeinated beverages or chocolate 12 hours prior to your test. Do not take any antihistamines 12 hours prior to your test.  On the Day of the Test: Drink plenty of water until 1 hour prior to the test. Do not eat any food 4 hours prior to the test. You may take  your regular  medications prior to the test.  Take metoprolol (Lopressor) two hours prior to test. FEMALES- please wear underwire-free bra if available, avoid dresses & tight clothing      After the Test: Drink plenty of water. After receiving IV contrast, you may experience a mild flushed feeling. This is normal. On occasion, you may experience a mild rash up to 24 hours after the test. This is not dangerous. If this occurs, you can take Benadryl 25 mg and increase your fluid intake. If you experience trouble breathing, this can be serious. If it is severe call 911 IMMEDIATELY. If it is mild, please call our office. If you take any of these medications: Glipizide/Metformin, Avandament, Glucavance, please do not take 48 hours after completing test unless otherwise instructed.  Please allow 2-4 weeks for scheduling of routine cardiac CTs. Some insurance companies require a pre-authorization which may delay scheduling of this test.   For non-scheduling related questions, please contact the cardiac imaging nurse navigator should you have any questions/concerns: Rockwell Alexandria, Cardiac Imaging Nurse Navigator Larey Brick, Cardiac Imaging Nurse Navigator [facility] Direct Office Dial: [phone]   For scheduling needs, including cancellations and rescheduling, please call [place], [phone].    Signed, Little Ishikawa, MD  07/26/2021 10:53 AM    [facility]

## 2021-07-26 ENCOUNTER — Encounter: Payer: Self-pay | Admitting: Cardiology

## 2021-07-26 ENCOUNTER — Ambulatory Visit (INDEPENDENT_AMBULATORY_CARE_PROVIDER_SITE_OTHER): Payer: Commercial Managed Care - PPO | Admitting: Cardiology

## 2021-07-26 ENCOUNTER — Other Ambulatory Visit: Payer: Self-pay

## 2021-07-26 VITALS — BP 128/72 | HR 61 | Ht 66.5 in | Wt 201.2 lb

## 2021-07-26 DIAGNOSIS — R011 Cardiac murmur, unspecified: Secondary | ICD-10-CM | POA: Diagnosis not present

## 2021-07-26 DIAGNOSIS — E785 Hyperlipidemia, unspecified: Secondary | ICD-10-CM | POA: Diagnosis not present

## 2021-07-26 DIAGNOSIS — R079 Chest pain, unspecified: Secondary | ICD-10-CM | POA: Diagnosis not present

## 2021-07-26 DIAGNOSIS — Z01812 Encounter for preprocedural laboratory examination: Secondary | ICD-10-CM

## 2021-07-26 LAB — BASIC METABOLIC PANEL
BUN/Creatinine Ratio: 27 (ref 12–28)
BUN: 18 mg/dL (ref 8–27)
CO2: 26 mmol/L (ref 20–29)
Calcium: 9.8 mg/dL (ref 8.7–10.3)
Chloride: 100 mmol/L (ref 96–106)
Creatinine, Ser: 0.67 mg/dL (ref 0.57–1.00)
Glucose: 102 mg/dL — ABNORMAL HIGH (ref 65–99)
Potassium: 4.7 mmol/L (ref 3.5–5.2)
Sodium: 140 mmol/L (ref 134–144)
eGFR: 97 mL/min/{1.73_m2} (ref >59)

## 2021-07-26 MED ORDER — ROSUVASTATIN CALCIUM 20 MG PO TABS: 20.0000 mg | ORAL_TABLET | Freq: Every day | ORAL | 3 refills | Status: DC

## 2021-07-26 MED ORDER — METOPROLOL TARTRATE 25 MG PO TABS: ORAL_TABLET | ORAL | 0 refills | Status: DC

## 2021-07-26 NOTE — Patient Instructions (Signed)
Medication Instructions:  START rosuvastatin (Crestor) 20 mg daily  *If you need a refill on your cardiac medications before your next appointment, please call your pharmacy*   Lab Work: BMET today AND Please return for FASTING labs in 2-3 months (Lipid)  Our in office lab hours are Monday-Friday 8:00-4:00, closed for lunch 12:45-1:45 pm.  No appointment needed.  If you have labs (blood work) drawn today and your tests are completely normal, you will receive your results only by: MyChart Message (if you have MyChart) OR A paper copy in the mail If you have any lab test that is abnormal or we need to change your treatment, we will call you to review the results.  Testing/Procedures: Your physician has requested that you have an echocardiogram. Echocardiography is a painless test that uses sound waves to create images of your heart. It provides your doctor with information about the size and shape of your heart and how well your heart's chambers and valves are working. This procedure takes approximately one hour. There are no restrictions for this procedure. This will be done at our [facility] location:  [employer] [address]  Coronary CTA-see instructions below  Follow-Up: At [facility], you and your health needs are our priority.  As part of our continuing mission to provide you with exceptional heart care, we have created designated Provider Care Teams.  These Care Teams include your primary Cardiologist (physician) and Advanced Practice Providers (APPs -  Physician Assistants and Nurse Practitioners) who all work together to provide you with the care you need, when you need it.  We recommend signing up for the patient portal called "MyChart".  Sign up information is provided on this After Visit Summary.  MyChart is used to connect with patients for Virtual Visits (Telemedicine).  Patients are able to view lab/test results, encounter notes, upcoming appointments, etc.   Non-urgent messages can be sent to your provider as well.   To learn more about what you can do with MyChart, go to [email or web address].    Your next appointment:   6 month(s)  The format for your next appointment:   In Person  Provider:   Epifanio Lesches, MD   Other Instructions   Your cardiac CT will be scheduled at one of the below locations:   [facility] [address] [place], [place] [address] [phone]  If scheduled at [facility], please arrive at the [facility] main entrance (entrance A) of [facility] 30 minutes prior to test start time. Proceed to the [facility] Radiology Department (first floor) to check-in and test prep.   Please follow these instructions carefully (unless otherwise directed):   On the Night Before the Test: Be sure to Drink plenty of water. Do not consume any caffeinated/decaffeinated beverages or chocolate 12 hours prior to your test. Do not take any antihistamines 12 hours prior to your test.  On the Day of the Test: Drink plenty of water until 1 hour prior to the test. Do not eat any food 4 hours prior to the test. You may take your regular medications prior to the test.  Take metoprolol (Lopressor) two hours prior to test. FEMALES- please wear underwire-free bra if available, avoid dresses & tight clothing      After the Test: Drink plenty of water. After receiving IV contrast, you may experience a mild flushed feeling. This is normal. On occasion, you may experience a mild rash up to 24 hours after  the test. This is not dangerous. If this occurs, you can take Benadryl 25 mg and increase your fluid intake. If you experience trouble breathing, this can be serious. If it is severe call 911 IMMEDIATELY. If it is mild, please call our office. If you take any of these medications: Glipizide/Metformin, Avandament, Glucavance, please do not take 48 hours after completing test unless otherwise  instructed.  Please allow 2-4 weeks for scheduling of routine cardiac CTs. Some insurance companies require a pre-authorization which may delay scheduling of this test.   For non-scheduling related questions, please contact the cardiac imaging nurse navigator should you have any questions/concerns: Rockwell Alexandria, Cardiac Imaging Nurse Navigator Larey Brick, Cardiac Imaging Nurse Navigator [facility] Direct Office Dial: [phone]   For scheduling needs, including cancellations and rescheduling, please call [place], [phone].

## 2021-07-27 ENCOUNTER — Encounter: Payer: Self-pay | Admitting: Pulmonary Disease

## 2021-07-27 ENCOUNTER — Ambulatory Visit (INDEPENDENT_AMBULATORY_CARE_PROVIDER_SITE_OTHER): Payer: Commercial Managed Care - PPO | Admitting: Pulmonary Disease

## 2021-07-27 VITALS — BP 100/58 | HR 87 | Temp 98.4°F | Ht 66.5 in | Wt 202.8 lb

## 2021-07-27 DIAGNOSIS — Z8616 Personal history of COVID-19: Secondary | ICD-10-CM | POA: Diagnosis not present

## 2021-07-27 DIAGNOSIS — J449 Chronic obstructive pulmonary disease, unspecified: Secondary | ICD-10-CM | POA: Diagnosis not present

## 2021-07-27 NOTE — Patient Instructions (Addendum)
  Hx COVID-19 pneumonia Persistent lung nodule opacities CT Chest with persistent nodular opacities in the upper lung and clusters in lung bases  --CT Chest without contrast in 3 months  Moderate COPD Tobacco Abuse --CONTINUE Trelegy 100-62.5-25 ONE puff ONCE a day --Discussed regular aerobic activity 30 minutes 3-5 five days a week including walking and weight  Follow-up with me in 3 months. Schedule CT the week of December 5th

## 2021-07-27 NOTE — Progress Notes (Signed)
Subjective:   PATIENT ID: Nancy Farmer GENDER: female DOB: [birth year], MRN: [id number]   HPI  Chief Complaint  Patient presents with   Follow-up    F/u after PFT and CT scan   Reason for Visit: Follow-up  [name] is 65 year old female with COPD with recent COVID-19 pneumonia who presents for follow-up.  Synopsis: Established care with  Pulmonary in 03/2021. She was hospitalized 03/11/21-03/15/21 for COVID-19 pneumonia treated with remdesivir and steroids and discharged on 1L oxygen. Prior to this, she did not require oxygen at home.   She has had baseline shortness of breath for years. Denies wheezing or chronic cough. She was told she had COPD in 2015. She was previously on Symbicort and albuterol in the past. She has mainly used the rescue inhaler in recent years. She would use albuterol 2-3 times a week. She usually has annual bronchitis that would response levofloxacin with last episode in 2018.  Since discharge, she has improved however continues to have fatigue and has not been able to fully return to work. She has been using Combivent twice a day for chest heaviness/tightness and shortness of breath and believes that it may be helping however she has a cough with use. Her taste and smell have not returned. She has upper airway sensation of something stuck.  07/27/21 Since our last visit, she started and has been compliant with her Trelegy inhaler. She reporters feeling nearly at baseline since COVID. Continues to have some shortness of breath with exertion. Gets leg cramping with exertion.  No longer having coughing and wheezing.   Social History: Former smoker. Quit in 2015. 44 pack-years Works as Loss adjuster, chartered  Past Medical History:  Diagnosis Date   Bronchitis    COPD (chronic obstructive pulmonary disease) (HCC)    High cholesterol      Allergies  Allergen Reactions   Corticosteroids     Other reaction(s): rash/HA     Outpatient  Medications Prior to Visit  Medication Sig Dispense Refill   ascorbic acid (VITAMIN C) 500 MG tablet Take 1 tablet (500 mg total) by mouth daily.     Fluticasone-Umeclidin-Vilant (TRELEGY ELLIPTA) 100-62.5-25 MCG/INH AEPB Inhale 1 puff into the lungs daily. 1 each 0   Ipratropium-Albuterol (COMBIVENT IN) Inhale into the lungs.     metoprolol tartrate (LOPRESSOR) 25 MG tablet Take 25 mg (1 tablet) TWO hours prior to CT scan 1 tablet 0   rosuvastatin (CRESTOR) 20 MG tablet Take 1 tablet (20 mg total) by mouth daily. 90 tablet 3   Vitamin D, Ergocalciferol, (DRISDOL) 1.25 MG (50000 UNIT) CAPS capsule Take 50,000 Units by mouth once a week.     zinc sulfate 220 (50 Zn) MG capsule Take 1 capsule (220 mg total) by mouth daily.     No facility-administered medications prior to visit.    Review of Systems  Constitutional:  Negative for chills, diaphoresis, fever, malaise/fatigue and weight loss.  HENT:  Negative for congestion.   Respiratory:  Positive for shortness of breath. Negative for cough, hemoptysis, sputum production and wheezing.   Cardiovascular:  Negative for chest pain, palpitations and leg swelling.    Objective:   Vitals:   07/27/21 1056  BP: (!) 100/58  Pulse: 87  Temp: 98.4 F (36.9 C)  TempSrc: Oral  SpO2: 93%  Weight: 202 lb 12.8 oz (92 kg)  Height: 5' 6.5" (1.689 m)      Physical Exam: General: Well-appearing, no acute distress HENT: [place], AT  Eyes: EOMI, no scleral icterus Respiratory: Clear to auscultation bilaterally.  No crackles, wheezing or rales Cardiovascular: RRR, -M/R/G, no JVD Extremities:-Edema,-tenderness Neuro: AAO x4, CNII-XII grossly intact Psych: Normal mood, normal affect  Data Reviewed:  Imaging: CTA 03/11/21 - no pulmonary emboli. Peripheral focal nodular opacities scattered bilaterally CT Chest 07/07/21 - Overall unchanged, peripheral focal nodule clusters bilaterally with interval 67mm nodule in RUL, suspected to be  inflammatory  PFT: 07/05/21 FVC 2.54 (73%) FEV1 1.75 (65%) Ratio 66  TLC 94% DLCO 70% Interpretation: Moderately severe obstructive defect with significant bronchodilator reponse in FEV1. Reduced DLCO may suggest emphysema however no evidence of air trapping but co-comitant restrictive may be present. Significant bronchodilator response present.  Labs: CBC    Component Value Date/Time   WBC 7.0 03/14/2021 0410   RBC 4.15 03/14/2021 0410   HGB 12.7 03/14/2021 0410   HCT 39.5 03/14/2021 0410   PLT 172 03/14/2021 0410   MCV 95.2 03/14/2021 0410   MCH 30.6 03/14/2021 0410   MCHC 32.2 03/14/2021 0410   RDW 13.5 03/14/2021 0410   LYMPHSABS 1.3 03/14/2021 0410   MONOABS 0.4 03/14/2021 0410   EOSABS 0.0 03/14/2021 0410   BASOSABS 0.0 03/14/2021 0410      Assessment & Plan:   Discussion: 65 year old female with COPD and prior COVID-19 pneumonia who presents for follow-up. We reviewed CT and PFTs. Will continue to follow CT abnormalities with serial imaging. Her lung function is consistent with COPD. Reduced DLCO may be related to emphysema with co-comitant restrictive defect vs post-inflammatory changes. She is responsive to bronchodilators so will continue  COVID-19 pneumonia CT Chest with persistent nodular opacities in the upper lung and clusters in lung bases  --CT Chest in 3 months ADDENDUM: Will change to CT in 6 months after telephone encounter on 08/10/21. Patient plans to have cardiac CT in 08/2021  Moderate COPD Tobacco Abuse --CONTINUE Trelegy 100-62.5-25 ONE puff ONCE a day --Discussed regular aerobic activity 30 minutes 3-5 five days a week including walking and weight  Fatigue - improving --Likely secondary to COVID-19 pneumonia --Recommend regular activity including aerobic activity daily  Health Maintenance Immunization History  Administered Date(s) Administered   Tdap 08/10/2011   CT Lung Screen - not indicated  Orders Placed This Encounter  Procedures   CT  Chest Wo Contrast    Schedule in 3 months, patient prefers Mondays (this is her day off) 10/26/21    Standing Status:   Future    Standing Expiration Date:   07/27/2022    Scheduling Instructions:     Schedule in 3 months, patient prefers Mondays (this is her day off)    Order Specific Question:   Preferred imaging location?    Answer:   [id number]   No orders of the defined types were placed in this encounter.   Return in about 6 months (around 01/24/2022).  I have spent a total time of 33-minutes on the day of the appointment reviewing prior documentation, coordinating care and discussing medical diagnosis and plan with the patient/family. Past medical history, allergies, medications were reviewed. Pertinent imaging, labs and tests included in this note have been reviewed and interpreted independently by me.  Madelyn Tlatelpa Mechele Collin, MD [facility] 07/27/2021 10:49 AM  Office Number [phone]

## 2021-07-28 ENCOUNTER — Other Ambulatory Visit: Payer: Self-pay | Admitting: Family Medicine

## 2021-07-28 DIAGNOSIS — E2839 Other primary ovarian failure: Secondary | ICD-10-CM

## 2021-08-02 ENCOUNTER — Encounter: Payer: Self-pay | Admitting: Family Medicine

## 2021-08-02 ENCOUNTER — Other Ambulatory Visit (HOSPITAL_COMMUNITY)
Admission: RE | Admit: 2021-08-02 | Discharge: 2021-08-02 | Disposition: A | Discharge status: Home / Self Care | Payer: Commercial Managed Care - PPO | Source: Ambulatory Visit | Attending: Family Medicine | Admitting: Family Medicine

## 2021-08-02 ENCOUNTER — Ambulatory Visit (INDEPENDENT_AMBULATORY_CARE_PROVIDER_SITE_OTHER): Payer: Commercial Managed Care - PPO | Admitting: Family Medicine

## 2021-08-02 ENCOUNTER — Other Ambulatory Visit: Payer: Self-pay

## 2021-08-02 VITALS — BP 131/64 | HR 78 | Ht 66.5 in | Wt 199.7 lb

## 2021-08-02 DIAGNOSIS — Z01419 Encounter for gynecological examination (general) (routine) without abnormal findings: Secondary | ICD-10-CM | POA: Diagnosis not present

## 2021-08-02 DIAGNOSIS — Z124 Encounter for screening for malignant neoplasm of cervix: Secondary | ICD-10-CM | POA: Insufficient documentation

## 2021-08-02 DIAGNOSIS — E7800 Pure hypercholesterolemia, unspecified: Secondary | ICD-10-CM | POA: Insufficient documentation

## 2021-08-02 NOTE — Progress Notes (Signed)
Subjective:     Nancy Farmer is a 65 y.o. female and is here for a comprehensive physical exam. The patient reports no problems. Has h/o cones, multiple previously. Is not sexually active now. Last 2 paps are WNL--? Pelvic organs falling. Holds urine well. Has vulvar warts  The following portions of the patient's history were reviewed and updated as appropriate: allergies, current medications, past family history, past medical history, past social history, past surgical history, and problem list.  Review of Systems Pertinent items noted in HPI and remainder of comprehensive ROS otherwise negative.   Objective:    BP 131/64   Pulse 78   Ht 5' 6.5" (1.689 m)   Wt 199 lb 11.2 oz (90.6 kg)   BMI 31.75 kg/m  General appearance: alert, cooperative, and appears stated age Head: Normocephalic, without obvious abnormality, atraumatic Neck: no adenopathy, supple, symmetrical, trachea midline, and thyroid not enlarged, symmetric, no tenderness/mass/nodules Lungs: clear to auscultation bilaterally Breasts: normal appearance, no masses or tenderness Heart: regular rate and rhythm, S1, S2 normal, no murmur, click, rub or gallop Abdomen: soft, non-tender; bowel sounds normal; no masses,  no organomegaly Pelvic: cervix normal in appearance, external genitalia normal, no adnexal masses or tenderness, no cervical motion tenderness, uterus normal size, shape, and consistency, and vaginal atrophy and 2 quiescent skin tags, which may have been warts, unchanged per her and no worrisome features, good pelvic support Extremities: extremities normal, atraumatic, no cyanosis or edema Pulses: 2+ and symmetric Skin: Skin color, texture, turgor normal. No rashes or lesions Lymph nodes: Cervical, supraclavicular, and axillary nodes normal. Neurologic: Grossly normal    Assessment:    GYN female exam.      Plan:  Encounter for gynecological examination without abnormal finding  Screening for malignant  neoplasm of cervix - Plan: Cytology - PAP( [place])  Normal mammogram in 06/2021.   Return in 1 year (on 08/02/2022).  See After Visit Summary for Counseling Recommendations

## 2021-08-03 LAB — CYTOLOGY - PAP
Comment: NEGATIVE
Diagnosis: NEGATIVE
High risk HPV: NEGATIVE

## 2021-08-10 ENCOUNTER — Encounter: Payer: Self-pay | Admitting: Pulmonary Disease

## 2021-08-20 ENCOUNTER — Other Ambulatory Visit (HOSPITAL_COMMUNITY): Payer: Self-pay | Admitting: Emergency Medicine

## 2021-08-20 DIAGNOSIS — Z01812 Encounter for preprocedural laboratory examination: Secondary | ICD-10-CM

## 2021-08-20 DIAGNOSIS — R079 Chest pain, unspecified: Secondary | ICD-10-CM

## 2021-08-23 ENCOUNTER — Other Ambulatory Visit: Payer: Commercial Managed Care - PPO | Admitting: *Deleted

## 2021-08-23 ENCOUNTER — Other Ambulatory Visit: Payer: Self-pay

## 2021-08-23 ENCOUNTER — Ambulatory Visit (HOSPITAL_COMMUNITY): Payer: Commercial Managed Care - PPO | Attending: Cardiovascular Disease

## 2021-08-23 DIAGNOSIS — R011 Cardiac murmur, unspecified: Secondary | ICD-10-CM | POA: Diagnosis not present

## 2021-08-23 DIAGNOSIS — R079 Chest pain, unspecified: Secondary | ICD-10-CM

## 2021-08-23 LAB — ECHOCARDIOGRAM COMPLETE
AR max vel: 1.32 cm2
AV Area VTI: 1.41 cm2
AV Area mean vel: 1.29 cm2
AV Mean grad: 19 mmHg
AV Peak grad: 36.5 mmHg
Ao pk vel: 3.02 m/s
Area-P 1/2: 2.33 cm2
S' Lateral: 2.9 cm

## 2021-08-24 LAB — BASIC METABOLIC PANEL
BUN/Creatinine Ratio: 27 (ref 12–28)
BUN: 21 mg/dL (ref 8–27)
CO2: 24 mmol/L (ref 20–29)
Calcium: 9.9 mg/dL (ref 8.7–10.3)
Chloride: 100 mmol/L (ref 96–106)
Creatinine, Ser: 0.77 mg/dL (ref 0.57–1.00)
Glucose: 122 mg/dL — ABNORMAL HIGH (ref 70–99)
Potassium: 4.2 mmol/L (ref 3.5–5.2)
Sodium: 141 mmol/L (ref 134–144)
eGFR: 86 mL/min/{1.73_m2} (ref >59)

## 2021-08-27 ENCOUNTER — Telehealth (HOSPITAL_COMMUNITY): Payer: Self-pay | Admitting: *Deleted

## 2021-08-27 NOTE — Telephone Encounter (Signed)
Reaching out to patient to offer assistance regarding upcoming cardiac imaging study; pt verbalizes understanding of appt date/time, parking situation and where to check in, pre-test NPO status and medications ordered, and verified current allergies; name and call back number provided for further questions should they arise  Ilea Hilton RN Navigator Cardiac Imaging [facility] [phone] office [phone] cell  Patient to take 25mg metoprolol tartrate two hours prior to cardiac CT scan. 

## 2021-08-30 ENCOUNTER — Ambulatory Visit (HOSPITAL_COMMUNITY): Admission: RE | Admit: 2021-08-30 | Payer: Commercial Managed Care - PPO | Source: Ambulatory Visit

## 2021-08-30 ENCOUNTER — Encounter (HOSPITAL_COMMUNITY): Payer: Self-pay

## 2021-08-30 ENCOUNTER — Telehealth (HOSPITAL_COMMUNITY): Payer: Self-pay | Admitting: Emergency Medicine

## 2021-08-30 NOTE — Telephone Encounter (Signed)
Patient calling to r/s her CCTA, states she is sick with nausea and cough  Requested that we r/s after 10 days.   Pt states she will call back when she figures out her work schedule.   Rockwell Alexandria RN Navigator Cardiac Imaging [facility] [phone] Office  [phone] Cell

## 2021-09-09 ENCOUNTER — Other Ambulatory Visit: Payer: Self-pay

## 2021-09-09 ENCOUNTER — Ambulatory Visit
Admission: RE | Admit: 2021-09-09 | Discharge: 2021-09-09 | Disposition: A | Discharge status: Home / Self Care | Payer: Commercial Managed Care - PPO | Source: Ambulatory Visit | Attending: Family Medicine | Admitting: Family Medicine

## 2021-09-09 DIAGNOSIS — E2839 Other primary ovarian failure: Secondary | ICD-10-CM

## 2021-09-27 ENCOUNTER — Other Ambulatory Visit: Payer: Self-pay | Admitting: *Deleted

## 2021-09-27 DIAGNOSIS — E785 Hyperlipidemia, unspecified: Secondary | ICD-10-CM

## 2021-10-25 ENCOUNTER — Ambulatory Visit: Payer: Commercial Managed Care - PPO

## 2021-12-03 ENCOUNTER — Encounter (HOSPITAL_COMMUNITY): Payer: Self-pay

## 2021-12-20 ENCOUNTER — Other Ambulatory Visit: Payer: Commercial Managed Care - PPO

## 2022-01-10 ENCOUNTER — Other Ambulatory Visit: Payer: Self-pay

## 2022-01-10 ENCOUNTER — Ambulatory Visit
Admission: RE | Admit: 2022-01-10 | Discharge: 2022-01-10 | Disposition: A | Discharge status: Home / Self Care | Payer: Commercial Managed Care - PPO | Source: Ambulatory Visit | Attending: Pulmonary Disease | Admitting: Pulmonary Disease

## 2022-01-10 DIAGNOSIS — J449 Chronic obstructive pulmonary disease, unspecified: Secondary | ICD-10-CM

## 2022-01-10 DIAGNOSIS — Z8616 Personal history of COVID-19: Secondary | ICD-10-CM

## 2022-01-16 NOTE — Progress Notes (Signed)
Cardiology Office Note:    Date:  01/20/2022   ID:  Nancy Farmer, DOB 1956-11-10, MRN [id number]  PCP:  Cari Caraway, MD  Cardiologist:  None  Electrophysiologist:  None   Referring MD: Cari Caraway, MD   Chief Complaint  Patient presents with   Aortic Stenosis    History of Present Illness:    Nancy Farmer is a 66 y.o. female with a hx of COPD, hyperlipidemia, cervical cancer who presents for follow-up.  She was referred by Dr. Addison Lank for evaluation of heart murmur, initially seen on 07/26/2021.  She was admitted to [facility] in May 2022 with acute hypoxic respiratory failure secondary to [id number] pneumonia complicated by underlying COPD.  She was treated with remdesivir and steroids.  She reports that it took about 2 months to recover from this, but she currently feels back to her baseline.  Does report has been having some cramping pain in her chest.  Does not happen regularly, about once every 2 months.  Occurs in lower chest and lasts for less than 1 minute.  Does occur with exertion but is not reliably brought on by exertion.  Reports she does not exercise.  Most exertion she does is walking at work.  Also reports she gets short of breath with exertion.  States that she has noticed walking her trash can to the streets she will get short of breath.  She denies any lightheadedness, syncope, or palpitations.  Reports occasional lower extremity edema.  She smoked 1 pack/day x 44 years but quit in 2015.  She is adopted and does not know her family history but reports her son was recently diagnosed with high cholesterol.  Echocardiogram 08/23/2021 showed normal biventricular function, mild to moderate aortic stenosis.  Coronary CTA ordered at last clinic visit but was not done.  Since last clinic visit, she reports no recent chest pains.  Does report getting short of breath with walking up a flight of stairs.  She denies any lightheadedness, syncope, lower extremity edema, or  palpitations.  Reports he stopped taking rosuvastatin 3 months ago.  She denies any issues with taking rosuvastatin.     Past Medical History:  Diagnosis Date   Bronchitis    COPD (chronic obstructive pulmonary disease) (HCC)    High cholesterol     Past Surgical History:  Procedure Laterality Date   CARPAL TUNNEL RELEASE Right    CERVICAL CONE BIOPSY N/A    LAZER CONIZATION     x2   TONSILLECTOMY      Current Medications: Current Meds  Medication Sig   acetaminophen (TYLENOL) 325 MG tablet Take 650 mg by mouth every 6 (six) hours as needed.   ascorbic acid (VITAMIN C) 500 MG tablet Take 1 tablet (500 mg total) by mouth daily.   Cholecalciferol (VITAMIN D3) 25 MCG (1000 UT) CAPS 1 capsule   Ipratropium-Albuterol (COMBIVENT IN) Inhale into the lungs.   ketoconazole (NIZORAL) 2 % shampoo Apply topically.   Vitamin D, Ergocalciferol, (DRISDOL) 1.25 MG (50000 UNIT) CAPS capsule Take 50,000 Units by mouth once a week.   zinc sulfate 220 (50 Zn) MG capsule Take 1 capsule (220 mg total) by mouth daily.     Allergies:   Corticosteroids   Social History   Socioeconomic History   Marital status: Divorced    Spouse name: Not on file   Number of children: Not on file   Years of education: Not on file   Highest education level: Not on file  Occupational History  Not on file  Tobacco Use   Smoking status: Former    Packs/day: 1.00    Years: 44.00    Pack years: 44.00    Types: Cigarettes    Start date: 11/14/1972    Quit date: 11/14/2013    Years since quitting: 8.1   Smokeless tobacco: Never  Substance and Sexual Activity   Alcohol use: No    Alcohol/week: 0.0 standard drinks   Drug use: No   Sexual activity: Not Currently    Birth control/protection: None  Other Topics Concern   Not on file  Social History Narrative   Not on file   Social Determinants of Health   Financial Resource Strain: Not on file  Food Insecurity: No Food Insecurity   Worried About Running  Out of Food in the Last Year: Never true   Ran Out of Food in the Last Year: Never true  Transportation Needs: No Transportation Needs   Lack of Transportation (Medical): No   Lack of Transportation (Non-Medical): No  Physical Activity: Not on file  Stress: Not on file  Social Connections: Not on file     Family History: Son had hyperlipidemia  ROS:   Please see the history of present illness.     All other systems reviewed and are negative.  EKGs/Labs/Other Studies Reviewed:    The following studies were reviewed today:   EKG:   01/17/22: NSR, rate 81, nonspecific T wave abnormality  Recent Labs: 03/14/2021: ALT 35; Hemoglobin 12.7; Magnesium 2.2; Platelets 172 01/17/2022: BUN 17; Creatinine, Ser 0.66; Potassium 3.8; Sodium 138  Recent Lipid Panel    Component Value Date/Time   CHOL 293 (H) 08/28/2014 1246   TRIG 81 03/11/2021 1433   HDL 47 08/28/2014 1246   CHOLHDL 6.2 08/28/2014 1246   VLDL 36 08/28/2014 1246   LDLCALC 210 (H) 08/28/2014 1246    Physical Exam:    VS:  BP (!) 101/58    Pulse 81    Ht 5' 6.05" (1.678 m)    Wt 204 lb 9.6 oz (92.8 kg)    SpO2 97%    BMI 32.97 kg/m     Wt Readings from Last 3 Encounters:  01/17/22 204 lb 9.6 oz (92.8 kg)  08/02/21 199 lb 11.2 oz (90.6 kg)  07/27/21 202 lb 12.8 oz (92 kg)     GEN:  Well nourished, well developed in no acute distress HEENT: Normal NECK: No JVD; No carotid bruits LYMPHATICS: No lymphadenopathy CARDIAC: RRR, 2/6 systolic murmur RESPIRATORY:  Clear to auscultation without rales, wheezing or rhonchi  ABDOMEN: Soft, non-tender, non-distended MUSCULOSKELETAL:  No edema; No deformity  SKIN: Warm and dry NEUROLOGIC:  Alert and oriented x 3 PSYCHIATRIC:  Normal affect   ASSESSMENT:    1. Chest pain of uncertain etiology   2. Aortic valve stenosis, etiology of cardiac valve disease unspecified   3. Hyperlipidemia, unspecified hyperlipidemia type   4. Pre-procedure lab exam     PLAN:    Chest pain:  Atypical in description but does have CAD risk factors (smoking history, marked hyperlipidemia).  Recent CT chest with LAD calcifications.  Coronary CTA ordered.  Will give metoprolol 50 mg prior to study.  Aortic stenosis: Echocardiogram 08/23/2021 showed normal biventricular function, mild to moderate aortic stenosis.  Will repeat echocardiogram in 1 year to monitor  Hyperlipidemia: LDL 275 on 05/11/2021.   Suspect FH.  She is adopted and does not know family history but her son was recently diagnosed with hyperlipidemia in  his 34s.  Started rosuvastatin 20 mg daily but states she stopped taking 3 months ago.  Recommend restarting rosuvastatin 20 mg daily.  Will plan to check lipid panel at next clinic appointment   RTC in 3 months    Medication Adjustments/Labs and Tests Ordered: Current medicines are reviewed at length with the patient today.  Concerns regarding medicines are outlined above.  Orders Placed This Encounter  Procedures   CT CORONARY MORPH W/CTA COR W/SCORE W/CA W/CM &/OR WO/CM   Basic metabolic panel   EKG [id number]    Meds ordered this encounter  Medications   metoprolol tartrate (LOPRESSOR) 50 MG tablet    Sig: Take 50 mg (1 tablet) TWO hours prior to CT scan    Dispense:  1 tablet    Refill:  0   rosuvastatin (CRESTOR) 20 MG tablet    Sig: Take 1 tablet (20 mg total) by mouth daily.    Dispense:  90 tablet    Refill:  3    Dose increase-d/c 10 mg     Patient Instructions  Medication Instructions:  RESTART rosuvastatin (Crestor)  *If you need a refill on your cardiac medications before your next appointment, please call your pharmacy*  Lab Work: BMET today  If you have labs (blood work) drawn today and your tests are completely normal, you will receive your results only by: [place] (if you have MyChart) OR A paper copy in the mail If you have any lab test that is abnormal or we need to change your treatment, we will call you to review the  results.   Testing/Procedures: Coronary CTA as ordered --take metoprolol (Lopressor) 50 mg TWO hours prior to CT  Follow-Up: At [facility], you and your health needs are our priority.  As part of our continuing mission to provide you with exceptional heart care, we have created designated Provider Care Teams.  These Care Teams include your primary Cardiologist (physician) and Advanced Practice Providers (APPs -  Physician Assistants and Nurse Practitioners) who all work together to provide you with the care you need, when you need it.  We recommend signing up for the patient portal called "MyChart".  Sign up information is provided on this After Visit Summary.  MyChart is used to connect with patients for Virtual Visits (Telemedicine).  Patients are able to view lab/test results, encounter notes, upcoming appointments, etc.  Non-urgent messages can be sent to your provider as well.   To learn more about what you can do with MyChart, go to [email or web address].    Your next appointment:   3 month(s)  The format for your next appointment:   In Person  Provider:   Dr. Gardiner Rhyme  Other Instructions   Your cardiac CT will be scheduled at one of the below locations:   [facility] [address] [place], [place] [address] [phone]  [facility] [address] [place], [place] [address] [phone]  If scheduled at [facility], please arrive at the [facility] and Children's Entrance (Entrance C2) of [facility] 30 minutes prior to test start time. You can use the FREE valet parking offered at entrance C (encouraged to control the heart rate for the test)  Proceed to the [facility] Radiology Department (first floor) to check-in and test prep.  All radiology patients and guests should use entrance C2 at [facility], accessed from [facility], even though the hospital's physical  address  listed is [address]    If scheduled at [facility], please arrive 15 mins early for check-in and test prep.  Please follow these instructions carefully (unless otherwise directed):  Hold all erectile dysfunction medications at least 3 days (72 hrs) prior to test.  On the Night Before the Test: Be sure to Drink plenty of water. Do not consume any caffeinated/decaffeinated beverages or chocolate 12 hours prior to your test. Do not take any antihistamines 12 hours prior to your test.  On the Day of the Test: Drink plenty of water until 1 hour prior to the test. Do not eat any food 4 hours prior to the test. You may take your regular medications prior to the test.  Take metoprolol (Lopressor) two hours prior to test. FEMALES- please wear underwire-free bra if available, avoid dresses & tight clothing      After the Test: Drink plenty of water. After receiving IV contrast, you may experience a mild flushed feeling. This is normal. On occasion, you may experience a mild rash up to 24 hours after the test. This is not dangerous. If this occurs, you can take Benadryl 25 mg and increase your fluid intake. If you experience trouble breathing, this can be serious. If it is severe call 911 IMMEDIATELY. If it is mild, please call our office. If you take any of these medications: Glipizide/Metformin, Avandament, Glucavance, please do not take 48 hours after completing test unless otherwise instructed.  We will call to schedule your test 2-4 weeks out understanding that some insurance companies will need an authorization prior to the service being performed.   For non-scheduling related questions, please contact the cardiac imaging nurse navigator should you have any questions/concerns: Marchia Bond, Cardiac Imaging Nurse Navigator Gordy Clement, Cardiac Imaging Nurse Navigator [place] [facility] Direct Office Dial:  [phone]   For scheduling needs, including cancellations and rescheduling, please call [place], [phone].     Signed, Donato Heinz, MD  01/20/2022 9:51 PM    [facility]

## 2022-01-17 ENCOUNTER — Encounter: Payer: Self-pay | Admitting: Cardiology

## 2022-01-17 ENCOUNTER — Ambulatory Visit (INDEPENDENT_AMBULATORY_CARE_PROVIDER_SITE_OTHER): Payer: Commercial Managed Care - PPO | Admitting: Cardiology

## 2022-01-17 ENCOUNTER — Other Ambulatory Visit: Payer: Self-pay

## 2022-01-17 VITALS — BP 101/58 | HR 81 | Ht 66.05 in | Wt 204.6 lb

## 2022-01-17 DIAGNOSIS — Z01812 Encounter for preprocedural laboratory examination: Secondary | ICD-10-CM

## 2022-01-17 DIAGNOSIS — E785 Hyperlipidemia, unspecified: Secondary | ICD-10-CM

## 2022-01-17 DIAGNOSIS — I350 Nonrheumatic aortic (valve) stenosis: Secondary | ICD-10-CM

## 2022-01-17 DIAGNOSIS — R079 Chest pain, unspecified: Secondary | ICD-10-CM

## 2022-01-17 MED ORDER — ROSUVASTATIN CALCIUM 20 MG PO TABS: 20.0000 mg | ORAL_TABLET | Freq: Every day | ORAL | 3 refills | Status: AC

## 2022-01-17 MED ORDER — METOPROLOL TARTRATE 50 MG PO TABS: ORAL_TABLET | ORAL | 0 refills | Status: AC

## 2022-01-17 NOTE — Patient Instructions (Signed)
Medication Instructions:  ?RESTART rosuvastatin (Crestor) ? ?*If you need a refill on your cardiac medications before your next appointment, please call your pharmacy* ? ?Lab Work: ?BMET today ? ?If you have labs (blood work) drawn today and your tests are completely normal, you will receive your results only by: ?MyChart Message (if you have MyChart) OR ?A paper copy in the mail ?If you have any lab test that is abnormal or we need to change your treatment, we will call you to review the results. ? ? ?Testing/Procedures: ?Coronary CTA as ordered ?--take metoprolol (Lopressor) 50 mg TWO hours prior to CT ? ?Follow-Up: ?At [facility], you and your health needs are our priority.  As part of our continuing mission to provide you with exceptional heart care, we have created designated Provider Care Teams.  These Care Teams include your primary Cardiologist (physician) and Advanced Practice Providers (APPs -  Physician Assistants and Nurse Practitioners) who all work together to provide you with the care you need, when you need it. ? ?We recommend signing up for the patient portal called "MyChart".  Sign up information is provided on this After Visit Summary.  MyChart is used to connect with patients for Virtual Visits (Telemedicine).  Patients are able to view lab/test results, encounter notes, upcoming appointments, etc.  Non-urgent messages can be sent to your provider as well.   ?To learn more about what you can do with MyChart, go to [email or web address].   ? ?Your next appointment:   ?3 month(s) ? ?The format for your next appointment:   ?In Person ? ?Provider:   ?Dr. Bjorn Pippin ? ?Other Instructions ? ? ?Your cardiac CT will be scheduled at one of the below locations:  ? ?[facility] ?[address] ?[place], [place] [address] ?[phone] ? ?OR ? ?[facility] ?[address] ?[address] ?[place], [place] [address] ?[phone] ? ?If scheduled at [facility], please arrive at the [facility] and Children's Entrance (Entrance C2) of [facility] 30 minutes prior to test start time. ?You can use the FREE valet parking offered at entrance C (encouraged to control the heart rate for the test)  ?Proceed to the [facility] Radiology Department (first floor) to check-in and test prep. ? ?All radiology patients and guests should use entrance C2 at [facility], accessed from [facility], even though the hospital's physical address listed is [address] ? ? ? ?If scheduled at [facility], please arrive 15 mins early for check-in and test prep. ? ?Please follow these instructions carefully (unless otherwise directed): ? ?Hold all erectile dysfunction medications at least 3 days (72 hrs) prior to test. ? ?On the Night Before the Test: ?Be sure to Drink plenty of water. ?Do not consume any caffeinated/decaffeinated beverages or chocolate 12 hours prior to your test. ?Do not take any antihistamines 12 hours prior to your test. ? ?On the Day of the Test: ?Drink plenty of water until 1 hour prior to the test. ?Do not eat any food 4 hours prior to the test. ?You may take your regular medications prior to the test.  ?Take metoprolol (Lopressor) two hours prior to test. ?FEMALES- please wear underwire-free bra if available, avoid dresses & tight clothing ?     ?After the Test: ?Drink plenty of water. ?After receiving IV contrast, you may experience a mild flushed feeling. This is normal. ?On occasion, you may experience a mild rash up to 24  hours after the test. This is not dangerous. If this occurs, you can take Benadryl 25 mg and increase your fluid intake. ?If you experience trouble breathing, this can be serious. If it is severe call 911 IMMEDIATELY. If it is mild, please call our office. ?If you take any of these medications: Glipizide/Metformin, Avandament, Glucavance, please do not take 48 hours after completing  test unless otherwise instructed. ? ?We will call to schedule your test 2-4 weeks out understanding that some insurance companies will need an authorization prior to the service being performed.  ? ?For non-scheduling related questions, please contact the cardiac imaging nurse navigator should you have any questions/concerns: ?Rockwell Alexandria, Cardiac Imaging Nurse Navigator ?Larey Brick, Cardiac Imaging Nurse Navigator ?[place] [facility] ?Direct Office Dial: [phone]  ? ?For scheduling needs, including cancellations and rescheduling, please call [place], [phone]. ? ? ?

## 2022-01-18 LAB — BASIC METABOLIC PANEL
BUN/Creatinine Ratio: 26 (ref 12–28)
BUN: 17 mg/dL (ref 8–27)
CO2: 22 mmol/L (ref 20–29)
Calcium: 9.8 mg/dL (ref 8.7–10.3)
Chloride: 99 mmol/L (ref 96–106)
Creatinine, Ser: 0.66 mg/dL (ref 0.57–1.00)
Glucose: 114 mg/dL — ABNORMAL HIGH (ref 70–99)
Potassium: 3.8 mmol/L (ref 3.5–5.2)
Sodium: 138 mmol/L (ref 134–144)
eGFR: 97 mL/min/{1.73_m2} (ref >59)

## 2022-03-07 ENCOUNTER — Ambulatory Visit: Payer: Commercial Managed Care - PPO | Admitting: Pulmonary Disease

## 2022-03-24 ENCOUNTER — Encounter (HOSPITAL_COMMUNITY): Payer: Self-pay

## 2022-03-25 IMAGING — CT CT CHEST W/O CM
2 of 4 series · 15 of 36 positions shown, 18 images · non-contrast
Comparison: March 11, 2021.

CLINICAL DATA: History of COVID pneumonia.

EXAM:
CT CHEST WITHOUT CONTRAST
TECHNIQUE: Multidetector CT imaging of the chest was performed following the
standard protocol without IV contrast.

[Series 2: thorax · axial · 0.74mm/px · z∈[-74,+190]mm · 12 of 157 slices shown, 15 images]
[im 13/157  mediastinal]
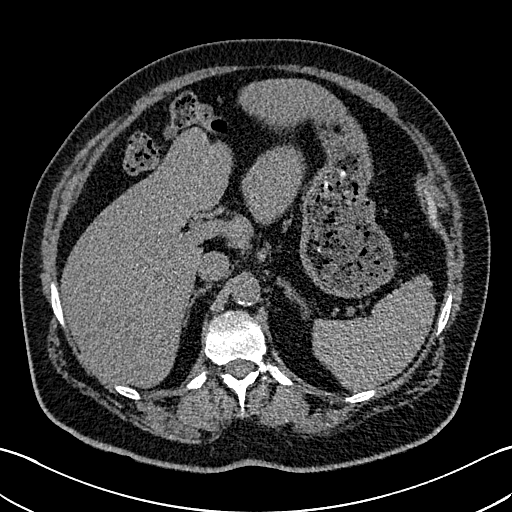
[im 13/157  lung]
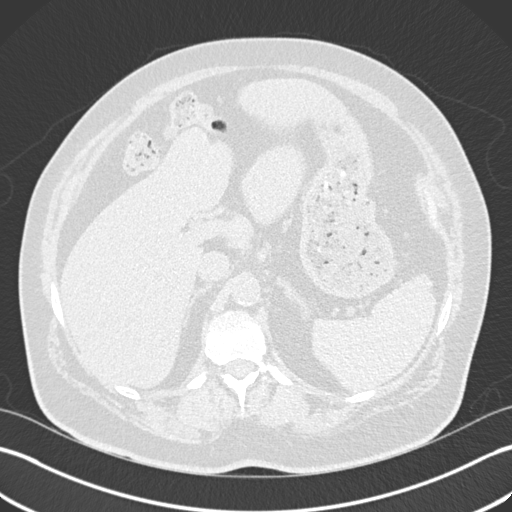
[im 25/157  lung]
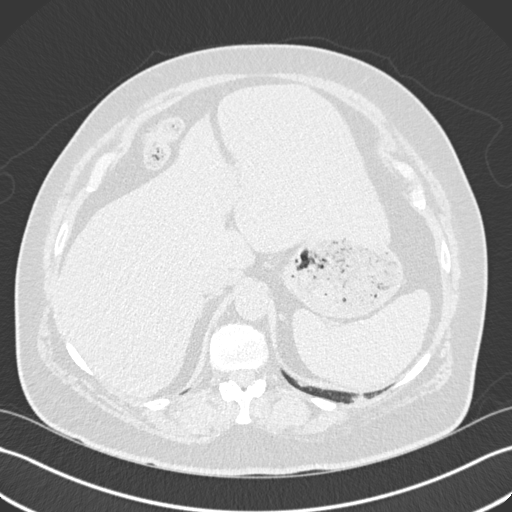
[im 37/157  lung]
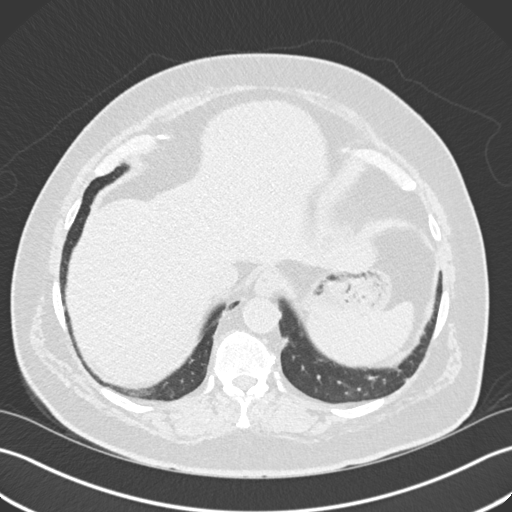
[im 49/157  lung]
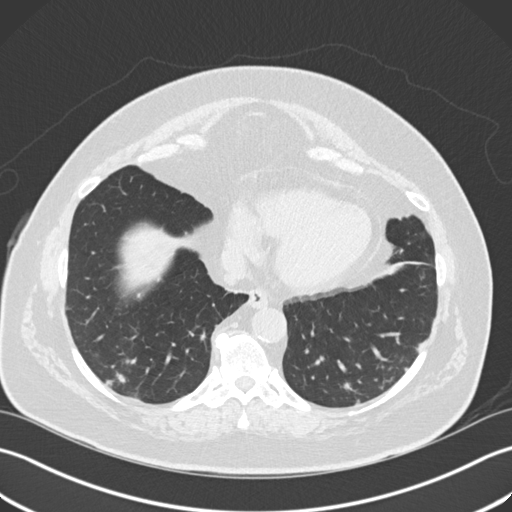
[im 61/157  mediastinal]
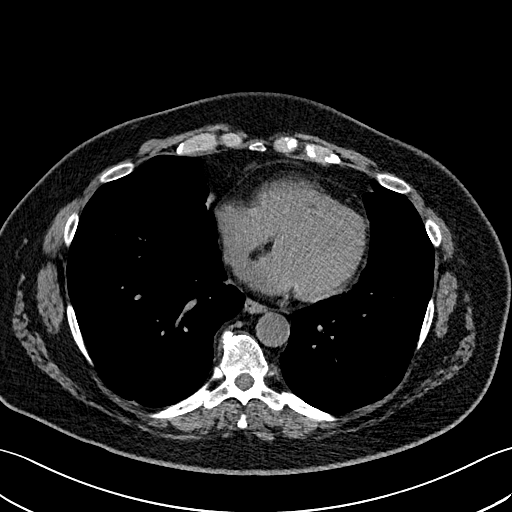
[im 61/157  lung]
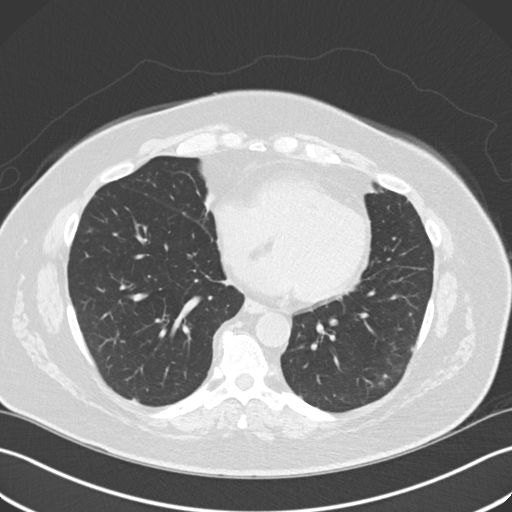
[im 73/157  lung]
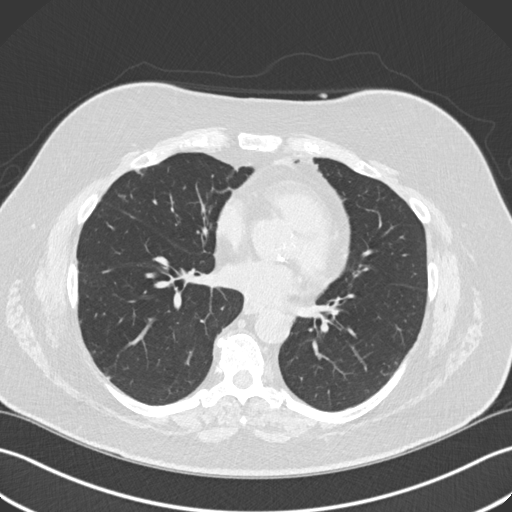
[im 85/157  lung]
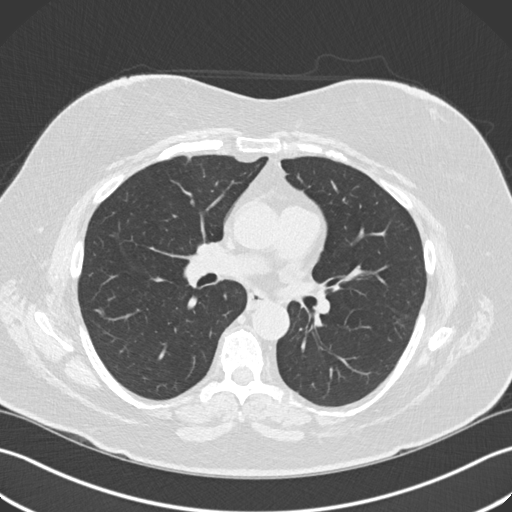
[im 97/157  lung]
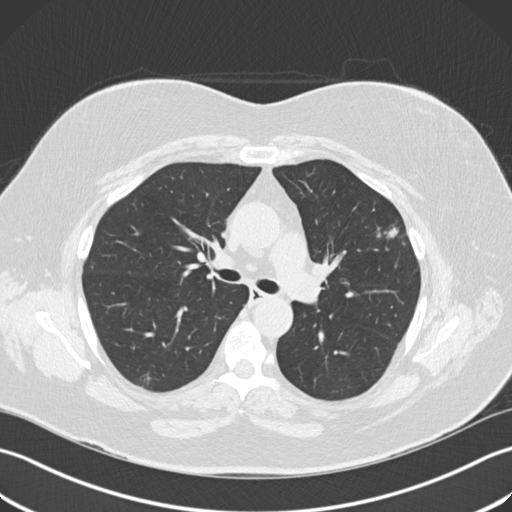
[im 109/157  mediastinal]
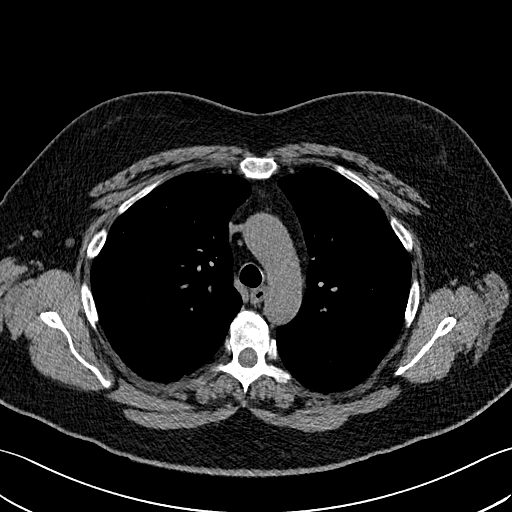
[im 109/157  lung]
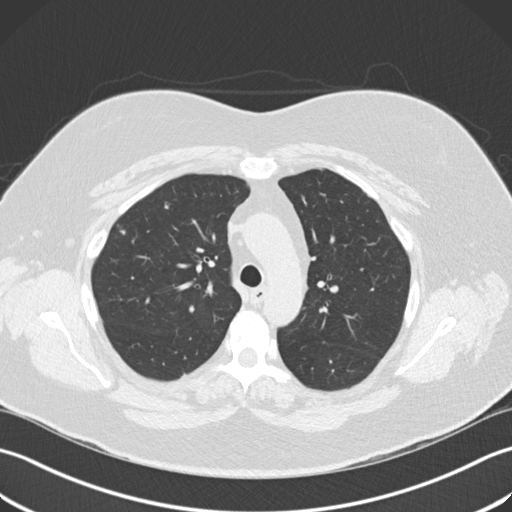
[im 121/157  lung]
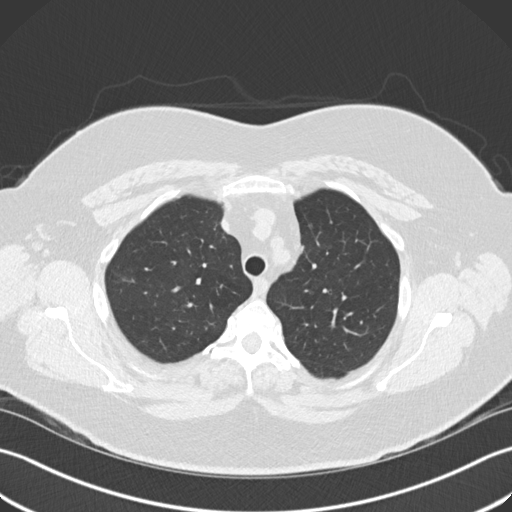
[im 133/157  lung]
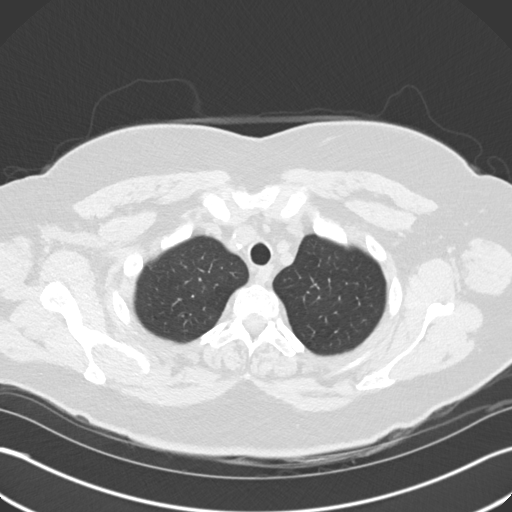
[im 145/157  lung]
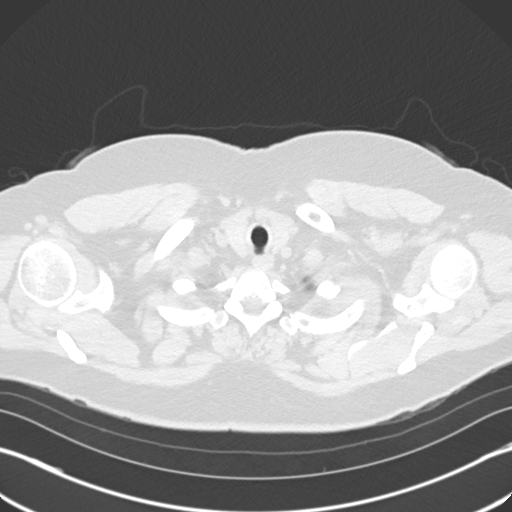

[Series 5: coronal · coronal · 0.61mm/px · 3 of 121 slices shown]
[im 25/121  lung]
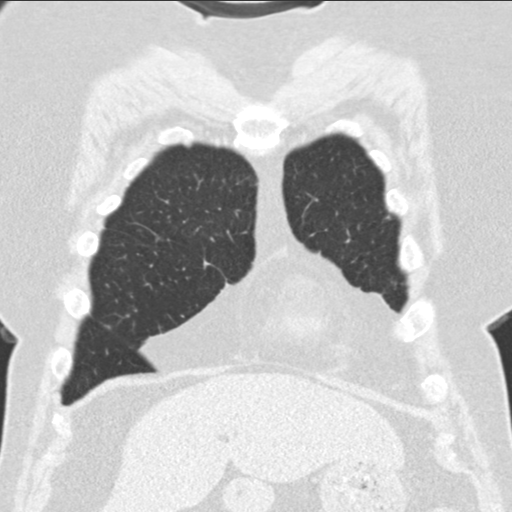
[im 49/121  lung]
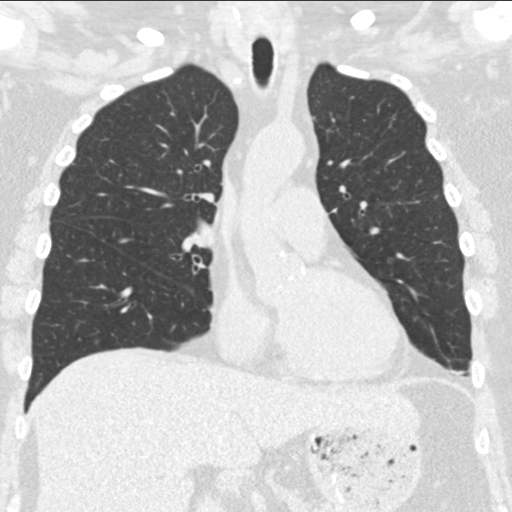
[im 73/121  lung]
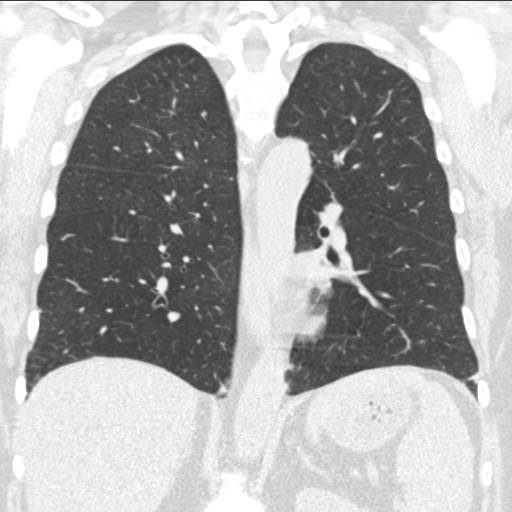

[15 of 36 positions shown; findings below may reference images not displayed]

FINDINGS: Cardiovascular: Atherosclerosis of thoracic aorta is noted without
aneurysm formation. Normal cardiac size. No pericardial effusion.

Mediastinum/Nodes: No enlarged mediastinal or axillary lymph nodes.
Thyroid gland, trachea, and esophagus demonstrate no significant
findings.

Lungs/Pleura: No pneumothorax or pleural effusion is noted. New 5 mm
irregular nodule is noted in right upper lobe laterally best seen on
image number 39 of series 3. Also noted clusters of irregular
nodules in the posterior portions of both lung bases which most
likely represent atypical inflammation. 7 mm irregular density is
noted in the left upper lobe best seen on image number 61 of series
3 with several smaller nodules around it, which may represent
atypical inflammation; this was present on prior exam.

Upper Abdomen: No acute abnormality.

Musculoskeletal: No chest wall mass or suspicious bone lesions
identified.
IMPRESSION: Left upper lobe nodular densities are noted which were present on
prior exam and most likely represent atypical inflammation. There
are new clusters of irregular nodules noted posteriorly in both lung
bases most consistent with atypical inflammation. New 5 mm nodule is
noted in right upper lobe which may be inflammatory in etiology, but
neoplasm can not be excluded. Non-contrast chest CT at 3-6 months is
recommended. If the nodules are stable at time of repeat CT, then
future CT at 18-24 months (from today's scan) is considered optional
for low-risk patients, but is recommended for high-risk patients.
This recommendation follows the consensus statement: Guidelines for
Management of Incidental Pulmonary Nodules Detected on CT Images:

Aortic Atherosclerosis (4MSEG-6MC.C).

## 2022-05-16 ENCOUNTER — Ambulatory Visit: Payer: Commercial Managed Care - PPO | Admitting: Cardiology

## 2022-08-29 ENCOUNTER — Encounter: Payer: Self-pay | Admitting: Pulmonary Disease

## 2022-08-29 ENCOUNTER — Ambulatory Visit (INDEPENDENT_AMBULATORY_CARE_PROVIDER_SITE_OTHER): Payer: Managed Care, Other (non HMO)

## 2022-08-29 ENCOUNTER — Ambulatory Visit (INDEPENDENT_AMBULATORY_CARE_PROVIDER_SITE_OTHER): Payer: Managed Care, Other (non HMO) | Admitting: Pulmonary Disease

## 2022-08-29 VITALS — BP 118/64 | HR 93 | Ht 66.0 in | Wt 211.2 lb

## 2022-08-29 DIAGNOSIS — U071 COVID-19: Secondary | ICD-10-CM

## 2022-08-29 MED ORDER — PREDNISONE 10 MG PO TABS: 30.0000 mg | ORAL_TABLET | Freq: Every day | ORAL | 0 refills | Status: AC

## 2022-08-29 MED ORDER — BREZTRI AEROSPHERE 160-9-4.8 MCG/ACT IN AERO: 2.0000 | INHALATION_SPRAY | Freq: Two times a day (BID) | RESPIRATORY_TRACT | 0 refills | Status: AC

## 2022-08-29 NOTE — Progress Notes (Signed)
Subjective:   PATIENT ID: Nancy Farmer GENDER: female DOB: 11-09-1956, MRN: [id number]   HPI  Chief Complaint  Patient presents with   Follow-up    Had covid march 2023   Reason for Visit: Follow-up  [name] is 66 year old female with COPD, hx COVID-19 pneumonia in 2022, aortic stenosis, HLD who presents for follow-up.  Synopsis: Established care with [facility] in 03/2021. She was hospitalized 03/11/21-03/15/21 for COVID-19 pneumonia treated with remdesivir and steroids and discharged on 1L oxygen. Prior to this, she did not require oxygen at home.   She has had baseline shortness of breath for years. Denies wheezing or chronic cough. She was told she had COPD in 2015. She was previously on Symbicort and albuterol in the past. She has mainly used the rescue inhaler in recent years. She would use albuterol 2-3 times a week. She usually has annual bronchitis that would response levofloxacin with last episode in 2018.  Since discharge, she has improved however continues to have fatigue and has not been able to fully return to work. She has been using Combivent twice a day for chest heaviness/tightness and shortness of breath and believes that it may be helping however she has a cough with use. Her taste and smell have not returned. She has upper airway sensation of something stuck.  07/27/21 Since our last visit, she started and has been compliant with her Trelegy inhaler. She reporters feeling nearly at baseline since COVID. Continues to have some shortness of breath with exertion. Gets leg cramping with exertion.  No longer having coughing and wheezing.   08/29/22 Since our last visit one year ago she had COVID again in March 2023. Two weeks ago she was treated with Paxlovid for COVID on home test. She continued to feel poorly and had increased productive cough. Currently on day 6 of levofloxacin. Only on inhalers as needed in between infections so self  discontinued maintenance inhalers.  Social History: Former smoker. Quit in 2015. 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     ascorbic acid (VITAMIN C) 500 MG tablet Take 1 tablet (500 mg total) by mouth daily.     Cholecalciferol (VITAMIN D3) 25 MCG (1000 UT) CAPS 1 capsule     Fluticasone-Umeclidin-Vilant (TRELEGY ELLIPTA) 100-62.5-25 MCG/INH AEPB Inhale 1 puff into the lungs daily. (Patient not taking: Reported on 01/17/2022) 1 each 0   Ipratropium-Albuterol (COMBIVENT IN) Inhale into the lungs.     ketoconazole (NIZORAL) 2 % shampoo Apply topically.     levofloxacin (LEVAQUIN) 750 MG tablet Take 750 mg by mouth daily.     metoprolol tartrate (LOPRESSOR) 50 MG tablet Take 50 mg (1 tablet) TWO hours prior to CT scan 1 tablet 0   rosuvastatin (CRESTOR) 20 MG tablet Take 1 tablet (20 mg total) by mouth daily. 90 tablet 3   Vitamin D, Ergocalciferol, (DRISDOL) 1.25 MG (50000 UNIT) CAPS capsule Take 50,000 Units by mouth once a week.     zinc sulfate 220 (50 Zn) MG capsule Take 1 capsule (220 mg  total) by mouth daily.     No facility-administered medications prior to visit.    Review of Systems  Constitutional:  Positive for malaise/fatigue. Negative for chills, diaphoresis, fever and weight loss.  HENT:  Negative for congestion.   Respiratory:  Positive for sputum production and shortness of breath. Negative for cough, hemoptysis and wheezing.   Cardiovascular:  Negative for chest pain, palpitations and leg swelling.     Objective:   Vitals:   08/29/22 1429  Pulse: 93  SpO2: 95%  Weight: 211 lb  3.2 oz (95.8 kg)  Height: 5\' 6"  (1.676 m)   SpO2: 95 %  Physical Exam: General: Well-appearing, no acute distress HENT: [place], AT Eyes: EOMI, no scleral icterus Respiratory: Clear to auscultation bilaterally.  No crackles, wheezing or rales Cardiovascular: RRR, -M/R/G, no JVD Extremities:-Edema,-tenderness Neuro: AAO x4, CNII-XII grossly intact Psych: Normal mood, normal affect  Data Reviewed:  Imaging: CTA 03/11/21 - no pulmonary emboli. Peripheral focal nodular opacities scattered bilaterally CT Chest 07/07/21 - Overall unchanged, peripheral focal nodule clusters bilaterally with interval 52mm nodule in RUL, suspected to be inflammatory CT Chest 01/10/22 - Persistent irregular nodular opacities in both lungs consistent with inflammatory changes.  CXR 08/29/22 - No acute infiltrate, effusion or edema  PFT: 07/05/21 FVC 2.54 (73%) FEV1 1.75 (65%) Ratio 66  TLC 94% DLCO 70% Interpretation: Moderately severe obstructive defect with significant bronchodilator reponse in FEV1. Reduced DLCO may suggest emphysema however no evidence of air trapping but co-comitant restrictive may be present. Significant bronchodilator response present.  Labs: CBC    Component Value Date/Time   WBC 7.0 03/14/2021 0410   RBC 4.15 03/14/2021 0410   HGB 12.7 03/14/2021 0410   HCT 39.5 03/14/2021 0410   PLT 172 03/14/2021 0410   MCV 95.2 03/14/2021 0410   MCH 30.6 03/14/2021 0410   MCHC 32.2 03/14/2021 0410   RDW 13.5 03/14/2021 0410   LYMPHSABS 1.3 03/14/2021 0410   MONOABS 0.4 03/14/2021 0410   EOSABS 0.0 03/14/2021 0410   BASOSABS 0.0 03/14/2021 0410      Assessment & Plan:   Discussion: 66 year old female with COPD and prior COVID-19 pneumonia who presents for follow-up. We reviewed CT and PFTs. Will continue to follow CT abnormalities with serial imaging. Her lung function is consistent with COPD. Reduced DLCO may be related to emphysema with co-comitant restrictive defect vs post-inflammatory  changes. She is responsive to bronchodilators so will continue  66 year old female with COPD and prior COVID-19 pneumonia who presents for follow-up. Reviewed prior CT and recommended follow-up to monitor for complete resolution of opacities. With recent covid pneumonia and ongoing respiratory symptoms. CXR reviewed - no acute infiltrate, effusion or edema. We reviewed clinical course of COVID-19 including long-term complications including post-inflammatory lung disease and long hauler symptoms.  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 S/p Paxlovid CT Chest with persistent nodular opacities in the upper lung and clusters in lung bases  --ORDER CT chest without contrast --START Breztri TWO puffs TWICE a day x 2 weeks. Sample given --START prednisone 30 mg x 5 days --Complete your levofloxacin  Moderate COPD History of tobacco abuse --Breztri as above --Discussed regular aerobic activity 30 minutes 3-5 five days a week including walking and weight   Health Maintenance Immunization History  Administered Date(s) Administered   Tdap 08/10/2011, 07/26/2021   CT Lung Screen - not indicated  Orders Placed This Encounter  Procedures   DG Chest 2 View  Standing Status:   Future    Number of Occurrences:   1    Standing Expiration Date:   08/30/2023    Order Specific Question:   Reason for Exam (SYMPTOM  OR DIAGNOSIS REQUIRED)    Answer:   covid pneumonia, productive cough, on antibiotics    Order Specific Question:   Preferred imaging location?    Answer:   Internal   CT Chest Wo Contrast    [employer] MANAGED Wt 211/No Needs /No spinal cord/No body injector/no glucose mon/no heart monitor//ab w/sherryofc Please remember if you need to cancel your appt, please do so 24 hours prior to your appointment to avoid getting charged a no-show fee of $75.00 pt is aware    Standing Status:   Future    Standing Expiration Date:   08/30/2023    Order Specific Question:    Preferred imaging location?    Answer:   [id number]   Meds ordered this encounter  Medications   Budeson-Glycopyrrol-Formoterol (BREZTRI AEROSPHERE) 160-9-4.8 MCG/ACT AERO    Sig: Inhale 2 puffs into the lungs in the morning and at bedtime.    Dispense:  5.9 g    Refill:  0    Order Specific Question:   Manufacturer?    Answer:   [employer] [[id number]]   predniSONE (DELTASONE) 10 MG tablet    Sig: Take 3 tablets (30 mg total) by mouth daily with breakfast for 5 days.    Dispense:  15 tablet    Refill:  0    Return in about 3 months (around 11/29/2022).  I have spent a total time of 31-minutes on the day of the appointment including chart review, data review, collecting history, coordinating care and discussing medical diagnosis and plan with the patient/family. Past medical history, allergies, medications were reviewed. Pertinent imaging, labs and tests included in this note have been reviewed and interpreted independently by me.  [place], MD [facility] 08/29/2022 2:31 PM  Office Number [phone]

## 2022-08-29 NOTE — Patient Instructions (Addendum)
  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 S/p Paxlovid CT Chest with persistent nodular opacities in the upper lung and clusters in lung bases  --ORDER CT chest without contrast --START Breztri TWO puffs TWICE a day x 2 weeks. Sample given --START prednisone 30 mg x 5 days --Complete your levofloxacin  Moderate COPD History of tobacco abuse --Breztri as above --Discussed regular aerobic activity 30 minutes 3-5 five days a week including walking and weight  Follow-up with me in 3 months

## 2022-10-03 ENCOUNTER — Other Ambulatory Visit: Payer: Managed Care, Other (non HMO)

## 2022-11-09 ENCOUNTER — Telehealth: Payer: Self-pay | Admitting: Pulmonary Disease

## 2022-11-09 NOTE — Telephone Encounter (Signed)
Patient called to request a refill for her antibiotic.  Please call to discuss at [phone]

## 2022-11-10 NOTE — Telephone Encounter (Signed)
Levaquin is an antibiotic for treatment of bacterial infections; not viruses. The only reason she was treated with levaquin when she had COVID previously was because she had recurrent pneumonia, which was likely due to bacterial coinfection. Her last CXR was improved. If she has new, confirmed COVID infection, she needs to be treated with antiviral (if she has not been prescribed one already, I can send this) and then she needs OV with Dr. Everardo All after her 5 days of quarantine.

## 2022-11-10 NOTE — Telephone Encounter (Signed)
She was treated with levaquin due to previous COVID pneumonia. Abx are not indicated for treatment of the flu. If she was exposed and has symptoms, she should be tested and prescribed antiviral if positive. She can use guaifenesin (mucinex) OTC for congestion. Thanks.

## 2022-11-11 MED ORDER — PREDNISONE 10 MG PO TABS: ORAL_TABLET | ORAL | 0 refills | Status: AC

## 2022-11-11 MED ORDER — LEVOFLOXACIN 750 MG PO TABS: 750.0000 mg | ORAL_TABLET | Freq: Every day | ORAL | 0 refills | Status: AC

## 2022-11-11 NOTE — Telephone Encounter (Signed)
Called and confirmed below in note from Dorisann Frames. I prescribed steroid taper which I recommended starting today. If no better in next several days could start antibiotic which I prescribed. I recommended alternative to levaquin to avoid incurring drug resistance, discussed this risk but she strongly prefers it, prescribed course of levaquin.  [facility]

## 2022-11-11 NOTE — Telephone Encounter (Signed)
Patient is calling again to speak with the nurse about getting some medication.  Read the last message to patient and she still is not understanding the note.  She would really like to speak with the doctor.  Please call patient to discuss at [phone].

## 2022-11-11 NOTE — Telephone Encounter (Signed)
Spoke with pt who states that she tested for Covid 3 - 4 days ago and is currently being treated with Paxlovid. Pt states she is having a productive cough that is producing yellow and brown mucus and is requesting a ABT and a possible steroid. Pt had completed course of ABT and prednisone taper in October. Pt was notified of Katie's advise but does not believe it accurate. Pt states that her lungs are not be treated and demanded to know what writer would prescribe her to "treat her lungs?" When pt was told that Clinical research associate is Charity fundraiser and message would have to be sent to provider of day pt became upset stating that she has been calling for 3 days and not been given anything yet. Dr. Thora Lance would you please advise on the situation as Dr. Everardo All is unavailable.

## 2023-10-09 ENCOUNTER — Telehealth: Payer: Self-pay | Admitting: Pulmonary Disease

## 2023-10-09 NOTE — Telephone Encounter (Signed)
PT calling for an antibx. States last year she was called in one. States she is fighting pneumonia. Household has pneumonia now.    [facility] in [place]   Her CB # is [phone]

## 2023-10-09 NOTE — Telephone Encounter (Signed)
Called and spoke with pt who needs a OV. Pt offered a OV , she got upset and  prematurely hung up. Nfn
